# Patient Record
Sex: Male | Born: 1975 | Race: White | Hispanic: No | Marital: Married | Smoking: Never smoker
Health system: Southern US, Community
[De-identification: ages and names within clinical notes are randomized; demographics above are authoritative.]

## PROBLEM LIST (undated history)

## (undated) DIAGNOSIS — I10 Essential (primary) hypertension: Secondary | ICD-10-CM

## (undated) DIAGNOSIS — R519 Headache, unspecified: Secondary | ICD-10-CM

## (undated) DIAGNOSIS — F419 Anxiety disorder, unspecified: Secondary | ICD-10-CM

## (undated) DIAGNOSIS — M1990 Unspecified osteoarthritis, unspecified site: Secondary | ICD-10-CM

## (undated) DIAGNOSIS — F32A Depression, unspecified: Secondary | ICD-10-CM

## (undated) HISTORY — PX: BACK SURGERY: SHX140

---

## 1998-04-30 ENCOUNTER — Emergency Department (HOSPITAL_COMMUNITY): Admission: EM | Admit: 1998-04-30 | Discharge: 1998-04-30 | Payer: Self-pay | Admitting: Emergency Medicine

## 1999-10-26 ENCOUNTER — Encounter: Payer: Self-pay | Admitting: Internal Medicine

## 1999-10-26 ENCOUNTER — Encounter: Admission: RE | Admit: 1999-10-26 | Discharge: 1999-10-26 | Payer: Self-pay | Admitting: Internal Medicine

## 2001-06-13 ENCOUNTER — Emergency Department (HOSPITAL_COMMUNITY): Admission: EM | Admit: 2001-06-13 | Discharge: 2001-06-13 | Payer: Self-pay | Admitting: Emergency Medicine

## 2001-06-13 ENCOUNTER — Encounter: Payer: Self-pay | Admitting: *Deleted

## 2002-02-16 ENCOUNTER — Ambulatory Visit (HOSPITAL_COMMUNITY): Admission: RE | Admit: 2002-02-16 | Discharge: 2002-02-16 | Payer: Self-pay | Admitting: Internal Medicine

## 2002-06-11 ENCOUNTER — Encounter: Payer: Self-pay | Admitting: Chiropractor

## 2002-06-11 ENCOUNTER — Ambulatory Visit (HOSPITAL_COMMUNITY): Admission: RE | Admit: 2002-06-11 | Discharge: 2002-06-11 | Payer: Self-pay | Admitting: Chiropractor

## 2002-06-18 ENCOUNTER — Ambulatory Visit (HOSPITAL_COMMUNITY): Admission: RE | Admit: 2002-06-18 | Discharge: 2002-06-19 | Payer: Self-pay | Admitting: Orthopaedic Surgery

## 2002-06-18 ENCOUNTER — Encounter: Payer: Self-pay | Admitting: Orthopaedic Surgery

## 2003-08-18 ENCOUNTER — Encounter: Admission: RE | Admit: 2003-08-18 | Discharge: 2003-08-18 | Payer: Self-pay | Admitting: Internal Medicine

## 2003-08-18 IMAGING — CR DG CHEST 2V
2 series · 2 of 2 positions shown · non-contrast
Comparison: none

CLINICAL DATA: The patient has a cough with fever.
 CHEST - 2 VIEWS 
 PA and lateral views of the chest reveal the heart size to be normal.  The perihilar markings are accentuated without areas of infiltration.
 IMPRESSION
 No active disease.

[view not recorded (1 of 2)]
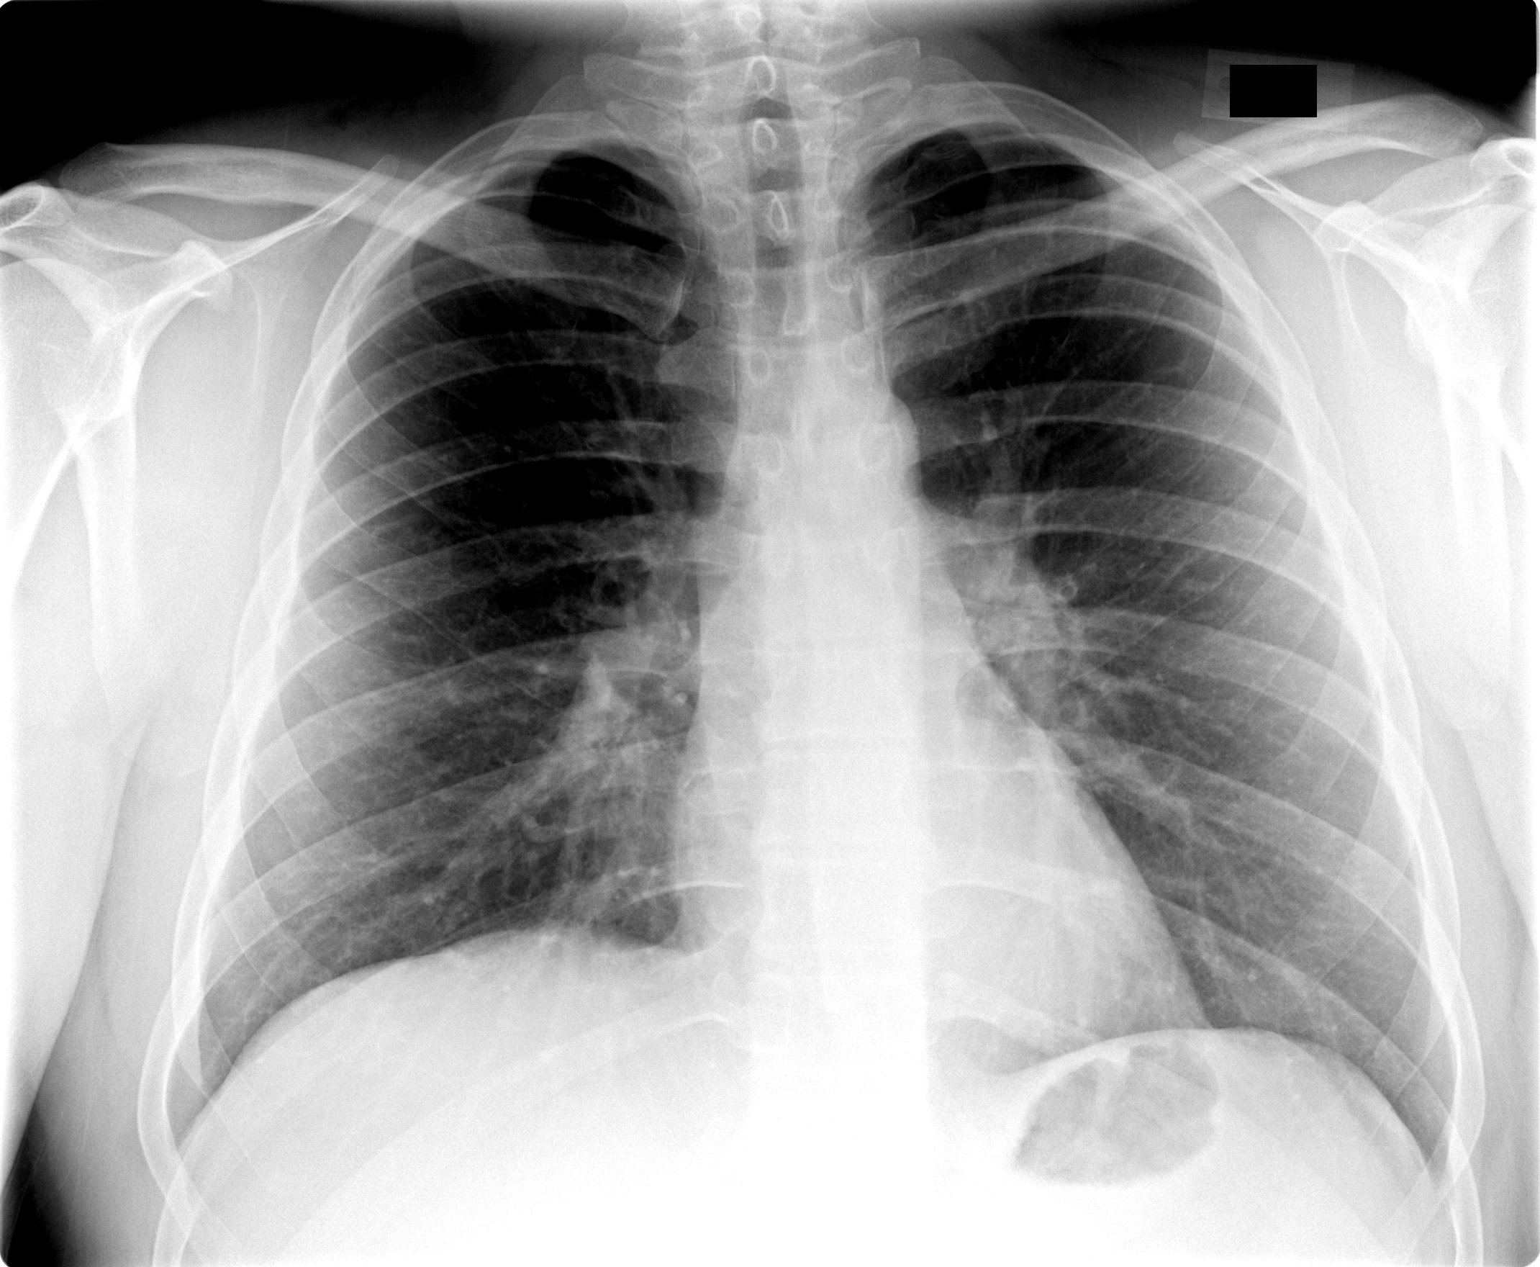

[view not recorded (2 of 2)]
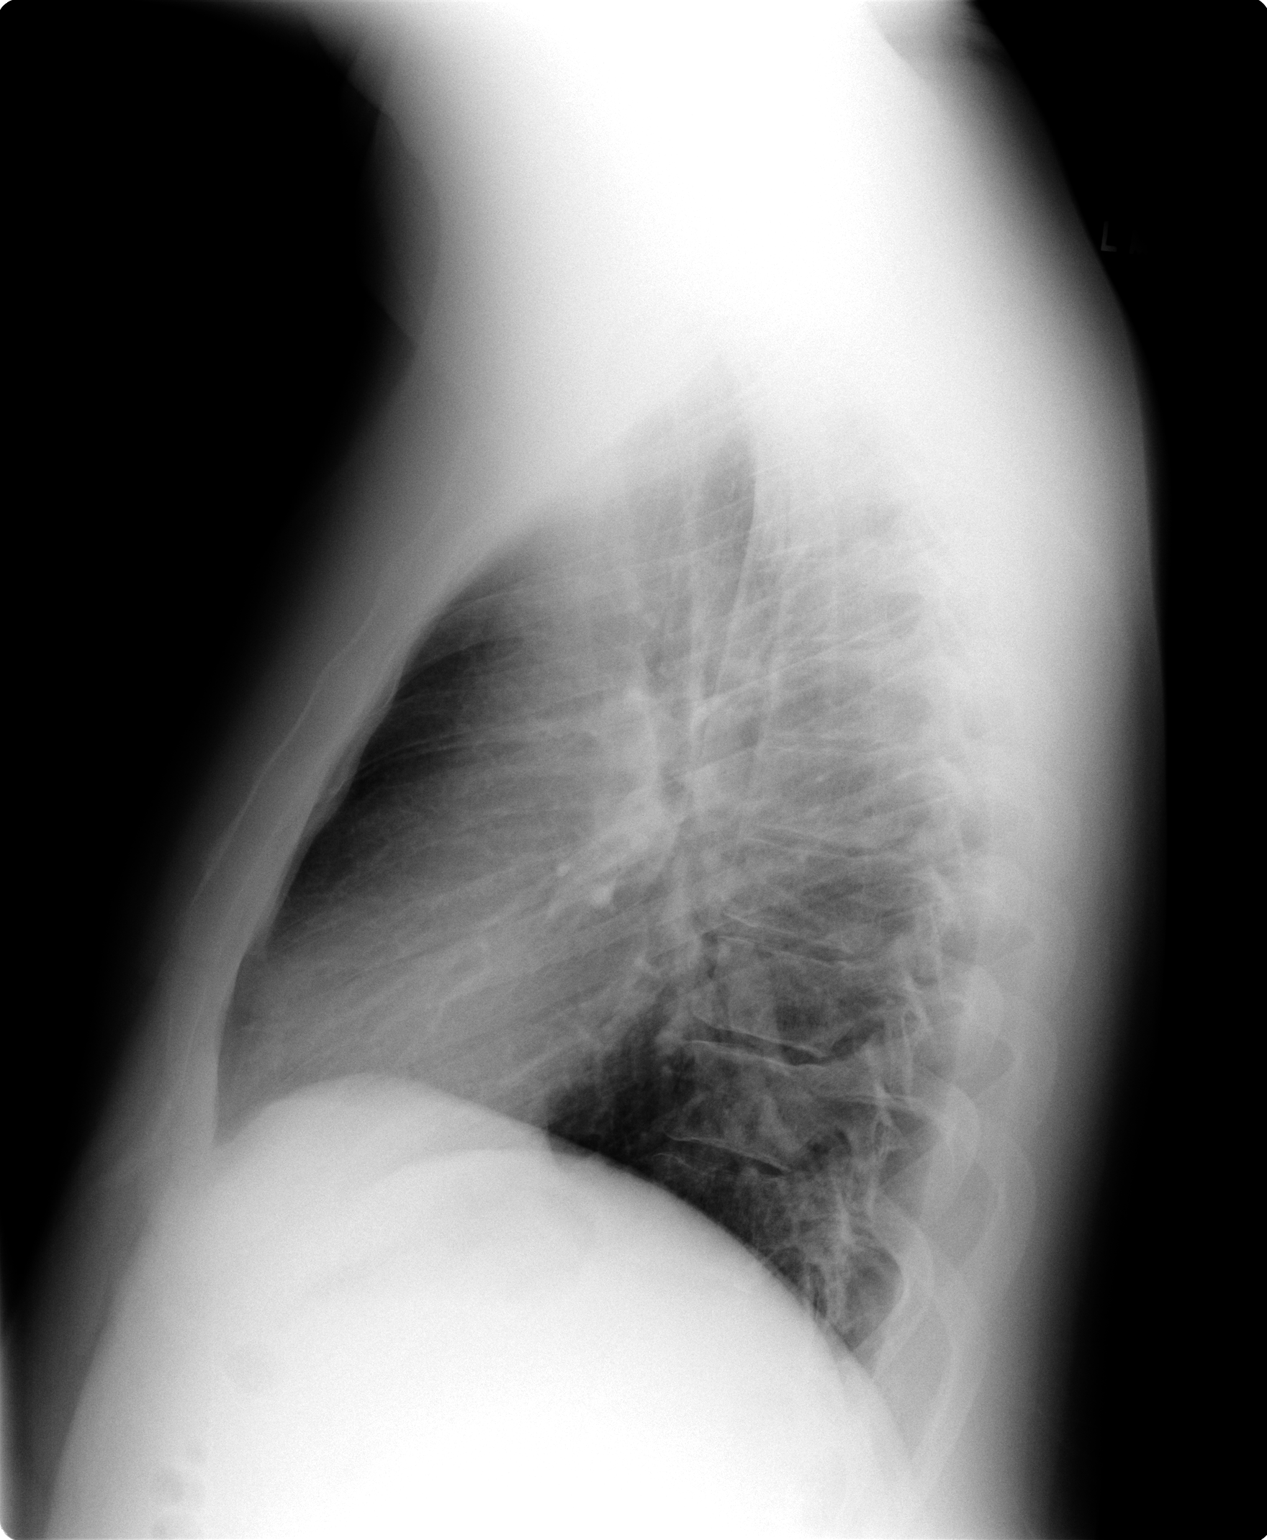

[2 of 2 positions shown; findings below may reference images not displayed]

## 2010-04-07 ENCOUNTER — Ambulatory Visit (HOSPITAL_COMMUNITY): Admission: RE | Admit: 2010-04-07 | Discharge: 2010-04-07 | Payer: Self-pay | Admitting: Internal Medicine

## 2010-10-13 NOTE — Op Note (Signed)
NAME:  Sergio Barr, Sergio Barr NO.:  [id number]   MEDICAL RECORD NO.:  [id number]                   PATIENT TYPE:  OIB   LOCATION:  2874                                 FACILITY:  [facility]   PHYSICIAN:  Sharolyn Douglas, M.D.                     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   OPERATION PERFORMED:  Microendoscopic left L4-5 diskectomy with removal of  extruded disk fragment.   SURGEON:  Sharolyn Douglas, M.D.   ASSISTANT:  Real Cons, P.A.   ANESTHESIA:  General endotracheal.   COMPLICATIONS:  None.   INDICATIONS FOR PROCEDURE:  The patient is a 35 year old male with a large  extruded disk herniation on the left at L4-5.  He has had a persistent left  lower extremity radiculopathy unresponsive to conservative treatment.  The  risks, benefits, and alternatives to microendoscopic diskectomy were  extensively reviewed with the patient and he elected to proceed in hopes of  improving his symptomatology.   DESCRIPTION OF PROCEDURE:  The patient was properly identified in the  holding area and taken to the operating room.  He underwent general  endotracheal anesthesia without difficulty.  He was given prophylactic IV  antibiotics.  He was carefully turned prone to the Acromed four post  positioning frame.  All bony prominences were padded.  Face and eyes  protected at all times.  Back was prepped and draped in the usual sterile  fashion.  Fluoroscopy was brought into the field and the L4-5 level was  localized.  A 1 cm incision was made 2 cm off the midline to the left over  the L4-5 disk space.  Successively larger dilators were then placed onto the  lamina facet junction of L4-5.  This was confirmed  under fluoroscopy.  We  then placed a Maxcess retractor over the final dilator and expanded the  muscle.  The L4 lamina and 4-5 facet joint as well as the interspace were  identified and cleaned of all soft tissue.  The surgical microscope was  prepped, draped and brought into the surgical field.  The remainder of the  operation was under direct microscopic illumination and magnification.  A  high speed bur was used to remove the inferior one third of the L4 lamina as  well as the medial one third of the 4-5 facet joint complex.  The ligamentum  flavum was elevated.  The common dural sac and L5 nerve root was gently  retracted medially.  Immediately, we identified a large disk fragment  cephalad to the L4 lamina and compressing the dural sac.  This was teased  out of the  spinal canal with a nerve hook and pituitary.  The fragment  measured almost 2 x 3 cm.  Immediately, the dural sac became more mobile.  We were then able to retract the L5 nerve root, identify the disk space  where there was a bulge and several more small fragments that were removed.  The disk space was entered and all loose disk material was removed using  pituitary rongeurs.  The disk space was copiously irrigated with Angiocath  irrigation floating out several small disk fragments that then were removed  with a pituitary.  We explored the spinal canal with a blunt probe and did  not identify any additional disk material.  The L5 nerve root was free from  its take off out its respective foramen.  The wound was copiously irrigated  with irrigation.  100 mcg of fentanyl was left in the epidural space.  The  deep fascia was closed with 0 Vicryl.  The subcutaneous layer closed with 2-  0 Vicryl followed by a running 4-0 Vicryl subcuticular suture.  Benzoin and  Steri-Strips placed.  Sterile dressing applied.  Patient turned supine,  extubated without difficulty, transferred to recovery room in stable  condition.                                                  Sharolyn Douglas, M.D.    MC/MEDQ  D:  06/18/2002  T:  06/18/2002  Job:  [id number]

## 2015-11-14 ENCOUNTER — Encounter (HOSPITAL_COMMUNITY): Payer: Self-pay

## 2015-11-14 ENCOUNTER — Emergency Department (HOSPITAL_COMMUNITY)
Admission: EM | Admit: 2015-11-14 | Discharge: 2015-11-14 | Disposition: A | Discharge status: Home / Self Care | Payer: 59 | Attending: Emergency Medicine | Admitting: Emergency Medicine

## 2015-11-14 ENCOUNTER — Emergency Department (HOSPITAL_COMMUNITY): Payer: 59

## 2015-11-14 ENCOUNTER — Other Ambulatory Visit: Payer: Self-pay

## 2015-11-14 DIAGNOSIS — R5383 Other fatigue: Secondary | ICD-10-CM | POA: Diagnosis not present

## 2015-11-14 DIAGNOSIS — I10 Essential (primary) hypertension: Secondary | ICD-10-CM | POA: Insufficient documentation

## 2015-11-14 DIAGNOSIS — R079 Chest pain, unspecified: Secondary | ICD-10-CM | POA: Diagnosis present

## 2015-11-14 DIAGNOSIS — R0789 Other chest pain: Secondary | ICD-10-CM | POA: Insufficient documentation

## 2015-11-14 HISTORY — DX: Essential (primary) hypertension: I10

## 2015-11-14 LAB — CBC
HEMATOCRIT: 46.6 % (ref 39.0–52.0)
Hemoglobin: 15.7 g/dL (ref 13.0–17.0)
MCH: 30.7 pg (ref 26.0–34.0)
MCHC: 33.7 g/dL (ref 30.0–36.0)
MCV: 91.2 fL (ref 78.0–100.0)
PLATELETS: 152 10*3/uL (ref 150–400)
RBC: 5.11 MIL/uL (ref 4.22–5.81)
RDW: 13 % (ref 11.5–15.5)
WBC: 8.3 10*3/uL (ref 4.0–10.5)

## 2015-11-14 LAB — I-STAT TROPONIN, ED
TROPONIN I, POC: 0 ng/mL (ref 0.00–0.08)
TROPONIN I, POC: 0.01 ng/mL (ref 0.00–0.08)

## 2015-11-14 LAB — BASIC METABOLIC PANEL
Anion gap: 10 (ref 5–15)
BUN: 13 mg/dL (ref 6–20)
CALCIUM: 10 mg/dL (ref 8.9–10.3)
CO2: 26 mmol/L (ref 22–32)
CREATININE: 1.09 mg/dL (ref 0.61–1.24)
Chloride: 103 mmol/L (ref 101–111)
GFR calc Af Amer: >60 mL/min (ref >60)
Glucose, Bld: 106 mg/dL — ABNORMAL HIGH (ref 65–99)
POTASSIUM: 4.7 mmol/L (ref 3.5–5.1)
SODIUM: 139 mmol/L (ref 135–145)

## 2015-11-14 IMAGING — DX DG CHEST 2V
2 series · 3 of 3 positions shown · non-contrast
Comparison: [DATE]

CLINICAL DATA: Chest pain, body aches, weakness, nausea

EXAM:
CHEST  2 VIEW

[Series 1: chest pa · 0.14mm/px · 2 of 2 slices shown]
[im 1/2]
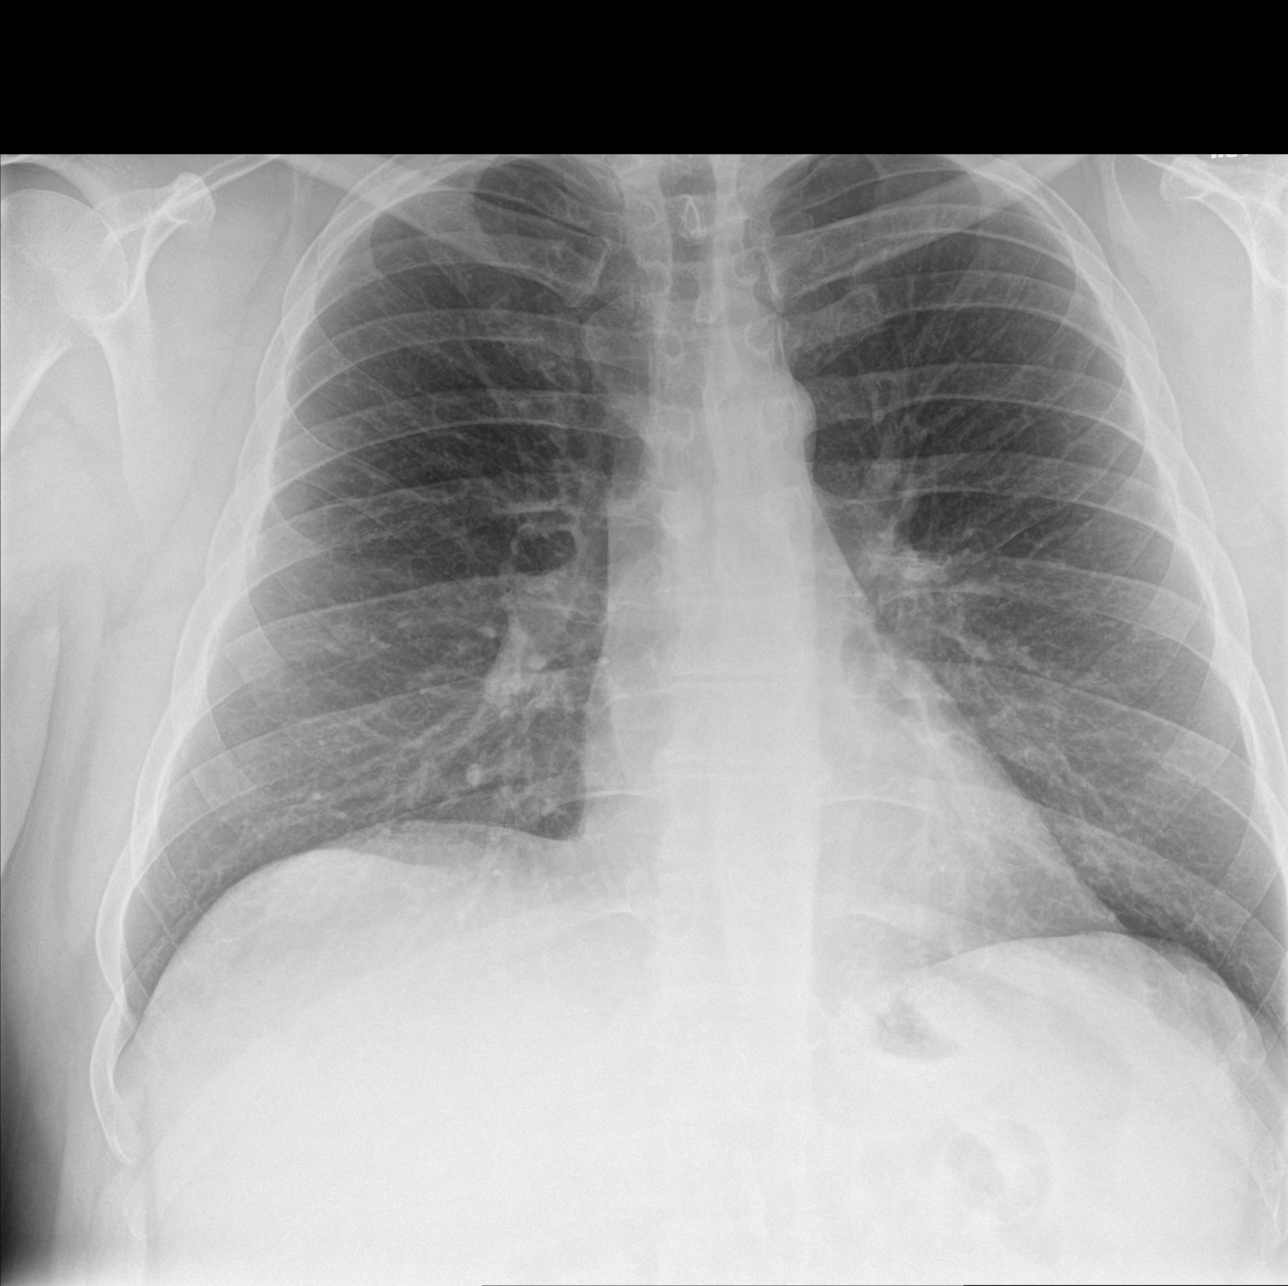
[im 2/2]
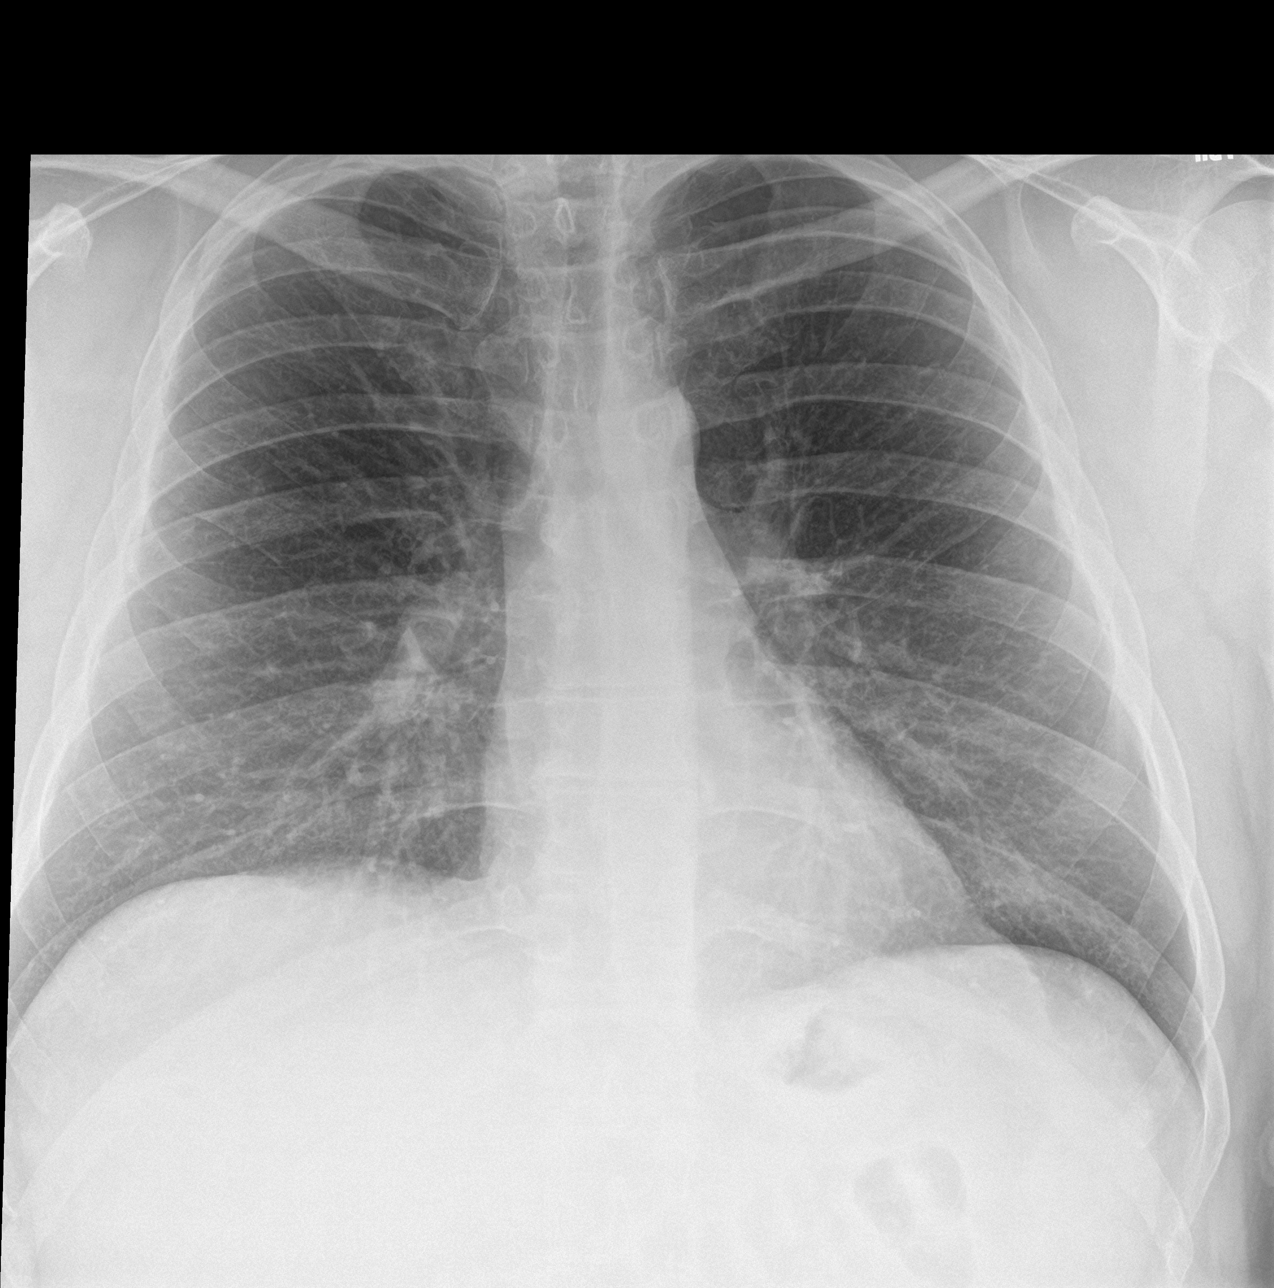

[chest lat]
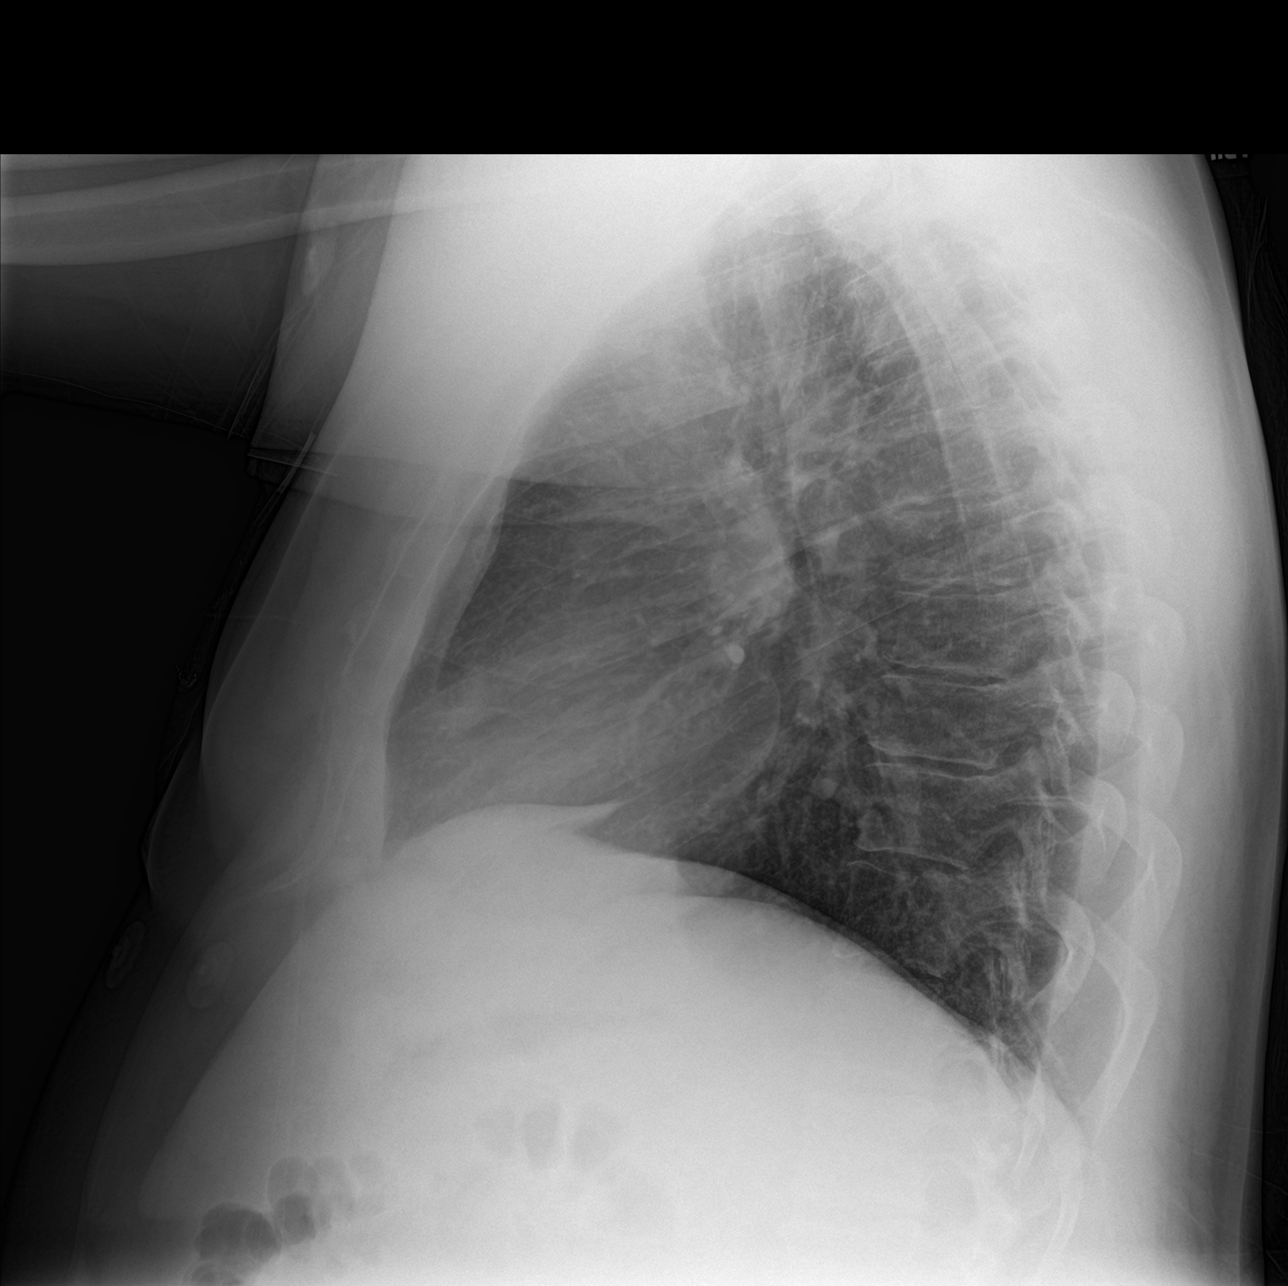

[3 of 3 positions shown; findings below may reference images not displayed]

FINDINGS: Cardiomediastinal silhouette is unremarkable. No acute infiltrate or
pleural effusion. No pulmonary edema. Bony thorax is unremarkable.
IMPRESSION: No active cardiopulmonary disease.

## 2015-11-14 MED ORDER — ALBUTEROL SULFATE (2.5 MG/3ML) 0.083% IN NEBU
5.0000 mg | INHALATION_SOLUTION | Freq: Once | RESPIRATORY_TRACT | Status: AC
  Administered 2015-11-14: 5 mg via RESPIRATORY_TRACT
  Filled 2015-11-14: qty 6

## 2015-11-14 MED ORDER — ASPIRIN 81 MG PO CHEW
324.0000 mg | CHEWABLE_TABLET | Freq: Once | ORAL | Status: AC
  Administered 2015-11-14: 324 mg via ORAL
  Filled 2015-11-14: qty 4

## 2015-11-14 MED ORDER — ONDANSETRON HCL 4 MG/2ML IJ SOLN
4.0000 mg | Freq: Once | INTRAMUSCULAR | Status: AC
  Administered 2015-11-14: 4 mg via INTRAVENOUS
  Filled 2015-11-14: qty 2

## 2015-11-14 NOTE — Discharge Instructions (Signed)
Please follow with your primary care doctor in the next 2 days for a check-up. They must obtain records for further management.  ° °Do not hesitate to return to the Emergency Department for any new, worsening or concerning symptoms.  ° ° °Nonspecific Chest Pain  °Chest pain can be caused by many different conditions. There is always a chance that your pain could be related to something serious, such as a heart attack or a blood clot in your lungs. Chest pain can also be caused by conditions that are not life-threatening. If you have chest pain, it is very important to follow up with your health care provider. °CAUSES  °Chest pain can be caused by: °· Heartburn. °· Pneumonia or bronchitis. °· Anxiety or stress. °· Inflammation around your heart (pericarditis) or lung (pleuritis or pleurisy). °· A blood clot in your lung. °· A collapsed lung (pneumothorax). It can develop suddenly on its own (spontaneous pneumothorax) or from trauma to the chest. °· Shingles infection (varicella-zoster virus). °· Heart attack. °· Damage to the bones, muscles, and cartilage that make up your chest wall. This can include: °¨ Bruised bones due to injury. °¨ Strained muscles or cartilage due to frequent or repeated coughing or overwork. °¨ Fracture to one or more ribs. °¨ Sore cartilage due to inflammation (costochondritis). °RISK FACTORS  °Risk factors for chest pain may include: °· Activities that increase your risk for trauma or injury to your chest. °· Respiratory infections or conditions that cause frequent coughing. °· Medical conditions or overeating that can cause heartburn. °· Heart disease or family history of heart disease. °· Conditions or health behaviors that increase your risk of developing a blood clot. °· Having had chicken pox (varicella zoster). °SIGNS AND SYMPTOMS °Chest pain can feel like: °· Burning or tingling on the surface of your chest or deep in your chest. °· Crushing, pressure, aching, or squeezing  pain. °· Dull or sharp pain that is worse when you move, cough, or take a deep breath. °· Pain that is also felt in your back, neck, shoulder, or arm, or pain that spreads to any of these areas. °Your chest pain may come and go, or it may stay constant. °DIAGNOSIS °Lab tests or other studies may be needed to find the cause of your pain. Your health care provider may have you take a test called an ambulatory ECG (electrocardiogram). An ECG records your heartbeat patterns at the time the test is performed. You may also have other tests, such as: °· Transthoracic echocardiogram (TTE). During echocardiography, sound waves are used to create a picture of all of the heart structures and to look at how blood flows through your heart. °· Transesophageal echocardiogram (TEE). This is a more advanced imaging test that obtains images from inside your body. It allows your health care provider to see your heart in finer detail. °· Cardiac monitoring. This allows your health care provider to monitor your heart rate and rhythm in real time. °· Holter monitor. This is a portable device that records your heartbeat and can help to diagnose abnormal heartbeats. It allows your health care provider to track your heart activity for several days, if needed. °· Stress tests. These can be done through exercise or by taking medicine that makes your heart beat more quickly. °· Blood tests. °· Imaging tests. °TREATMENT  °Your treatment depends on what is causing your chest pain. Treatment may include: °· Medicines. These may include: °¨ Acid blockers for heartburn. °¨ Anti-inflammatory medicine. °¨ Pain   medicine for inflammatory conditions. °¨ Antibiotic medicine, if an infection is present. °¨ Medicines to dissolve blood clots. °¨ Medicines to treat coronary artery disease. °· Supportive care for conditions that do not require medicines. This may include: °¨ Resting. °¨ Applying heat or cold packs to injured areas. °¨ Limiting activities  until pain decreases. °HOME CARE INSTRUCTIONS °· If you were prescribed an antibiotic medicine, finish it all even if you start to feel better. °· Avoid any activities that bring on chest pain. °· Do not use any tobacco products, including cigarettes, chewing tobacco, or electronic cigarettes. If you need help quitting, ask your health care provider. °· Do not drink alcohol. °· Take medicines only as directed by your health care provider. °· Keep all follow-up visits as directed by your health care provider. This is important. This includes any further testing if your chest pain does not go away. °· If heartburn is the cause for your chest pain, you may be told to keep your head raised (elevated) while sleeping. This reduces the chance that acid will go from your stomach into your esophagus. °· Make lifestyle changes as directed by your health care provider. These may include: °¨ Getting regular exercise. Ask your health care provider to suggest some activities that are safe for you. °¨ Eating a heart-healthy diet. A registered dietitian can help you to learn healthy eating options. °¨ Maintaining a healthy weight. °¨ Managing diabetes, if necessary. °¨ Reducing stress. °SEEK MEDICAL CARE IF: °· Your chest pain does not go away after treatment. °· You have a rash with blisters on your chest. °· You have a fever. °SEEK IMMEDIATE MEDICAL CARE IF:  °· Your chest pain is worse. °· You have an increasing cough, or you cough up blood. °· You have severe abdominal pain. °· You have severe weakness. °· You faint. °· You have chills. °· You have sudden, unexplained chest discomfort. °· You have sudden, unexplained discomfort in your arms, back, neck, or jaw. °· You have shortness of breath at any time. °· You suddenly start to sweat, or your skin gets clammy. °· You feel nauseous or you vomit. °· You suddenly feel light-headed or dizzy. °· Your heart begins to beat quickly, or it feels like it is skipping beats. °These  symptoms may represent a serious problem that is an emergency. Do not wait to see if the symptoms will go away. Get medical help right away. Call your local emergency services (911 in the [place] Do not drive yourself to the hospital. °  °This information is not intended to replace advice given to you by your health care provider. Make sure you discuss any questions you have with your health care provider. °  °Document Released: 02/21/2005 Document Revised: 06/04/2014 Document Reviewed: 12/18/2013 °Elsevier Interactive Patient Education ©2016 [employer] ° °

## 2015-11-14 NOTE — ED Notes (Signed)
Pt complaining of chest tightness. Pt also states faitgue. Pt states sharp left rib pain, radiating to L arm and leg.

## 2015-11-14 NOTE — ED Provider Notes (Signed)
CSN: [id number]     Arrival date & time 11/14/15  1557 History   First MD Initiated Contact with Patient 11/14/15 1759     Chief Complaint  Patient presents with  . Chest Pain     (Consider location/radiation/quality/duration/timing/severity/associated sxs/prior Treatment) HPI  Blood pressure 127/78, pulse 87, temperature 98.9 F (37.2 C), temperature source Oral, resp. rate 13, height 5\' 11"  (1.803 m), weight 122.471 kg, SpO2 98 %.  Montez MoritaWilliam F Zollars is a 40 y.o. male complaining of left sided chest pain onset upon waking this morning, states that sometimes it radiates to the left arm and left leg, he had a prodrome of fatigue over the last 3 weeks with no cough, fever, chills. Patient states that the pain is 8 out of 10 at worst, 4 out of 10 right now, states that the pain was worse while he was at work, he is a Psychologist, occupationalwelder, he doesn't think that it's exertional states that sometimes is exacerbated by deep breathing. He's had no appetite today and also soft stool with associated nausea. Patient smokes marijuana occasionally, he is not a tobacco smoker areas history of hypertension and he is compliant with his medications no hyperlipidemia, states father had heart attack at age 240 last stress test was 5 years ago without abnormality. Pt denies fever, cough, h/o DVT/PE, calf pain or leg swelling, hemoptysis, recent immobilization, cancer/chemotherapy in the last 6 months.    Past Medical History  Diagnosis Date  . Hypertension    History reviewed. No pertinent past surgical history. History reviewed. No pertinent family history. Social History  Substance Use Topics  . Smoking status: Never Smoker   . Smokeless tobacco: None  . Alcohol Use: Yes    Review of Systems  10 systems reviewed and found to be negative, except as noted in the HPI.   Allergies  Review of patient's allergies indicates not on file.  Home Medications   Prior to Admission medications   Not on File   BP 121/72  mmHg  Pulse 100  Temp(Src) 98.9 F (37.2 C) (Oral)  Resp 18  Ht 5\' 11"  (1.803 m)  Wt 122.471 kg  BMI 37.67 kg/m2  SpO2 97% Physical Exam  Constitutional: He is oriented to person, place, and time. He appears well-developed and well-nourished. No distress.  HENT:  Head: Normocephalic and atraumatic.  Mouth/Throat: Oropharynx is clear and moist.  Eyes: Conjunctivae and EOM are normal. Pupils are equal, round, and reactive to light.  Neck: Normal range of motion. No JVD present. No tracheal deviation present.  Cardiovascular: Normal rate, regular rhythm and intact distal pulses.   Radial pulse equal bilaterally  Pulmonary/Chest: Effort normal and breath sounds normal. No stridor. No respiratory distress. He has no wheezes. He has no rales. He exhibits no tenderness.  Abdominal: Soft. Bowel sounds are normal. He exhibits no distension and no mass. There is no tenderness. There is no rebound and no guarding.  Musculoskeletal: Normal range of motion. He exhibits no edema or tenderness.  No calf asymmetry, superficial collaterals, palpable cords, edema, Homans sign negative bilaterally.    Neurological: He is alert and oriented to person, place, and time.  Skin: Skin is warm. He is not diaphoretic.  Psychiatric: He has a normal mood and affect.  Nursing note and vitals reviewed.   ED Course  Procedures (including critical care time) Labs Review Labs Reviewed  BASIC METABOLIC PANEL - Abnormal; Notable for the following:    Glucose, Bld 106 (*)  All other components within normal limits  CBC  I-STAT TROPOININ, ED  Rosezena Sensor, ED    Imaging Review Dg Chest 2 View  11/14/2015  CLINICAL DATA:  Chest pain, body aches, weakness, nausea EXAM: CHEST  2 VIEW COMPARISON:  08/18/2003 FINDINGS: Cardiomediastinal silhouette is unremarkable. No acute infiltrate or pleural effusion. No pulmonary edema. Bony thorax is unremarkable. IMPRESSION: No active cardiopulmonary disease.  Electronically Signed   By: Natasha Mead M.D.   On: 11/14/2015 17:06   I have personally reviewed and evaluated these images and lab results as part of my medical decision-making.   EKG Interpretation   Date/Time:  Monday November 14 2015 16:05:09 EDT Ventricular Rate:  90 PR Interval:  128 QRS Duration: 82 QT Interval:  350 QTC Calculation: 428 R Axis:   68 Text Interpretation:  Normal sinus rhythm Normal ECG No old tracing to  compare Confirmed by [facility]  MD, ELLIOTT [phone]) on 11/14/2015 7:30:48 PM      MDM   Final diagnoses:  Atypical chest pain    Filed Vitals:   11/14/15 1900 11/14/15 1930 11/14/15 1945 11/14/15 2000  BP: 136/81 135/87 129/76 121/72  Pulse: 87 103 100 100  Temp:      TempSrc:      Resp: Height:      Weight:      SpO2: 100% 94% 95% 97%    Medications  ondansetron (ZOFRAN) injection 4 mg (4 mg Intravenous Given 11/14/15 1857)  aspirin chewable tablet 324 mg (324 mg Oral Given 11/14/15 1857)  albuterol (PROVENTIL) (2.5 MG/3ML) 0.083% nebulizer solution 5 mg (5 mg Nebulization Given 11/14/15 1856)    SALAH NAKAMURA is 40 y.o. male presenting with Chest tightness, somewhat pleuritic in nature onset this a.m. radiates to left arm and left leg, has prodrome of generalized fatigue for several months. Was sent by primary care for further evaluation. Patient has family history of CAD with father having heart attack in his 29s, patient smokes marijuana occasionally and also has high blood pressure. EKG with no arrhythmia or ischemic changes. Chest x-ray negative, initial troponin negative. Patient will be given full dose aspirin and will obtain delta troponin, patient is low risk by heart score and PERC negative.  Repeat troponin is negative.  Evaluation does not show pathology that would require ongoing emergent intervention or inpatient treatment. Pt is hemodynamically stable and mentating appropriately. Discussed findings and plan with patient/guardian,  who agrees with care plan. All questions answered. Return precautions discussed and outpatient follow up given.      Wynetta Emery, PA-C 11/14/15 2020  Mancel Bale, MD 11/15/15 [phone]

## 2017-03-20 ENCOUNTER — Ambulatory Visit: Payer: Self-pay

## 2017-03-20 ENCOUNTER — Ambulatory Visit: Payer: Self-pay | Admitting: Physician Assistant

## 2017-03-20 ENCOUNTER — Other Ambulatory Visit: Payer: Self-pay | Admitting: Occupational Medicine

## 2017-03-20 DIAGNOSIS — M79644 Pain in right finger(s): Secondary | ICD-10-CM

## 2017-03-20 IMAGING — DX DG FINGER THUMB 2+V*R*
3 series · 3 of 3 positions shown · non-contrast
Comparison: None

CLINICAL DATA: Blunt trauma the RIGHT fell on [DATE] [DATE], pain at
proximal phalanx

EXAM:
RIGHT THUMB 2+V

[finger ap]
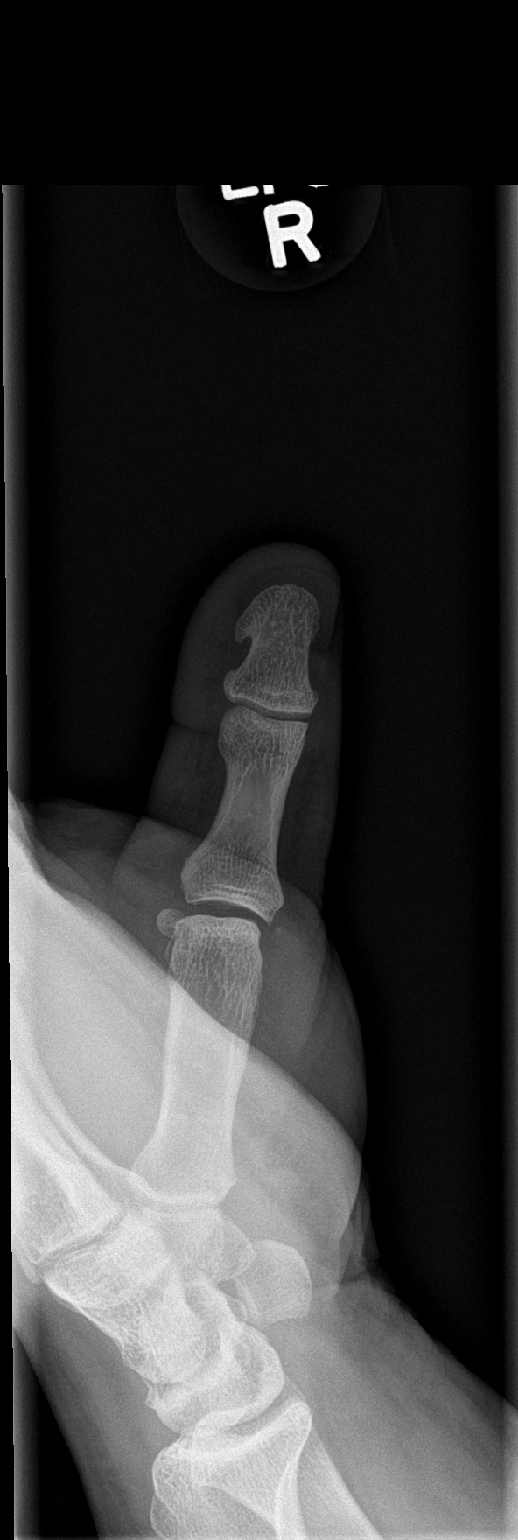

[finger obl]
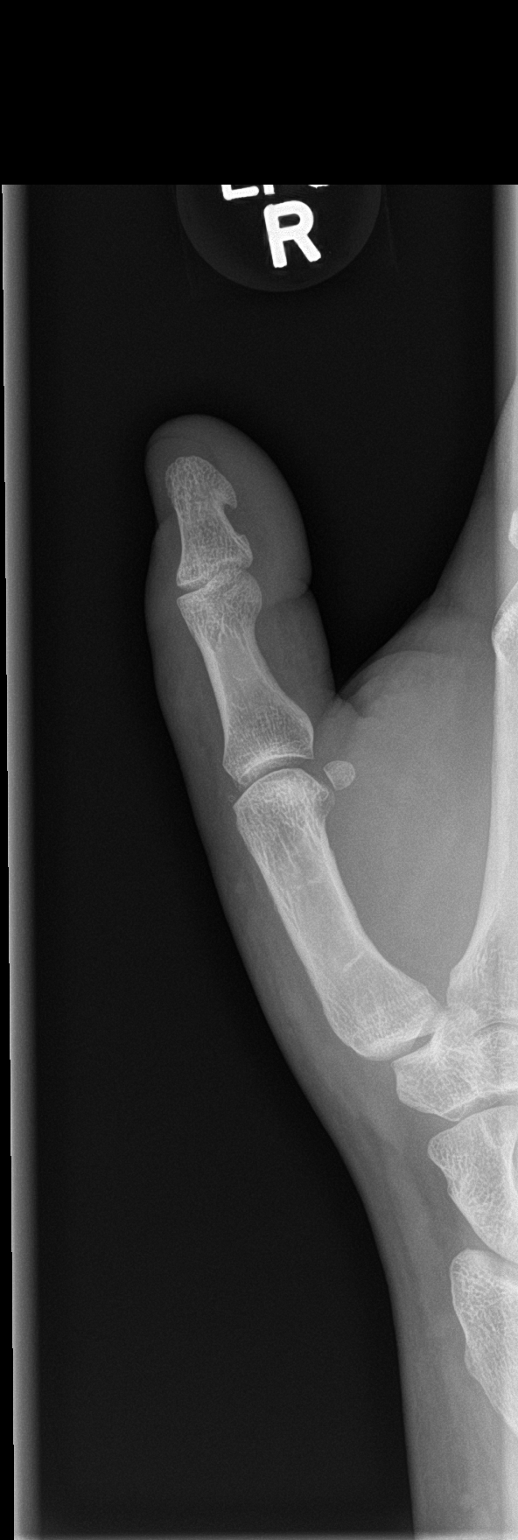

[finger lat]
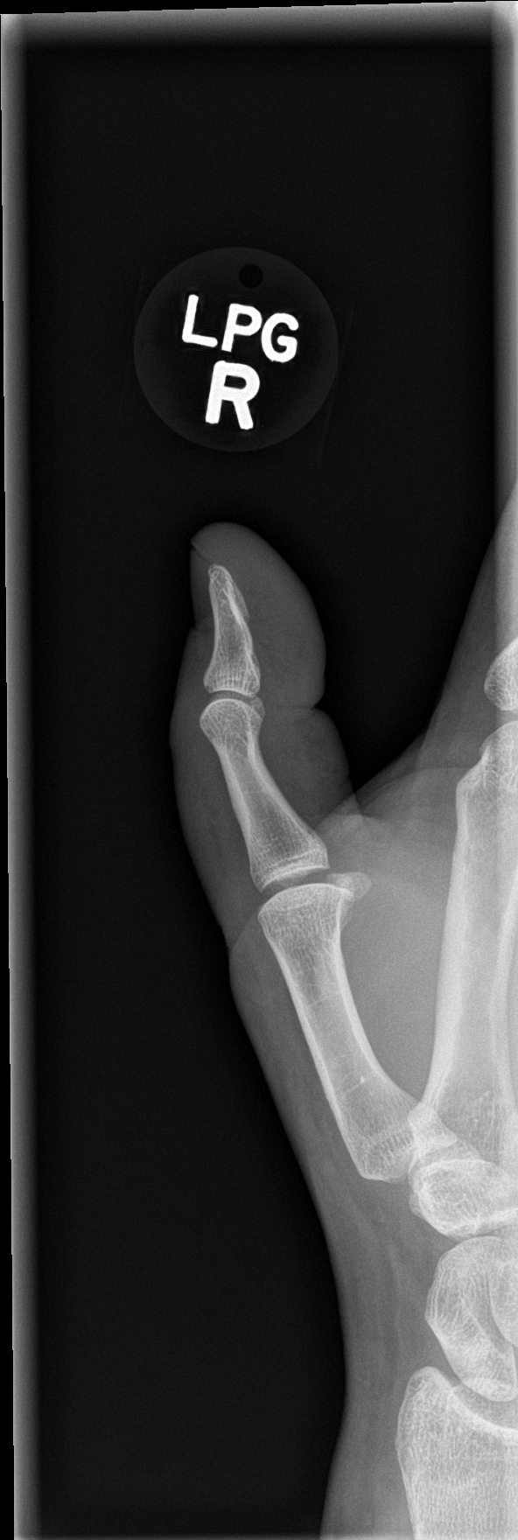

[3 of 3 positions shown; findings below may reference images not displayed]

FINDINGS: Osseous mineralization normal.

Joint spaces preserved.

No fracture, dislocation, or bone destruction.
IMPRESSION: Normal exam.

## 2019-08-04 ENCOUNTER — Other Ambulatory Visit: Payer: Self-pay | Admitting: Otolaryngology

## 2019-08-20 ENCOUNTER — Ambulatory Visit: Payer: 59 | Attending: Internal Medicine

## 2019-08-20 DIAGNOSIS — Z23 Encounter for immunization: Secondary | ICD-10-CM

## 2019-08-20 NOTE — Progress Notes (Signed)
   Covid-19 Vaccination Clinic  Name:  Sergio Barr    MRN: [id number] DOB: 1976/01/27  08/20/2019  [name] was observed post Covid-19 immunization for 15 minutes without incident. He was provided with Vaccine Information Sheet and instruction to access the V-Safe system.   [name] was instructed to call 911 with any severe reactions post vaccine: Marland Kitchen Difficulty breathing  . Swelling of face and throat  . A fast heartbeat  . A bad rash all over body  . Dizziness and weakness   Immunizations Administered    Name Date Dose VIS Date Route   Pfizer COVID-19 Vaccine 08/20/2019  3:42 PM 0.3 mL 05/08/2019 Intramuscular   Manufacturer: [employer], [employer]   Lot: [id number]   NDC: [address]-7

## 2019-08-21 ENCOUNTER — Ambulatory Visit: Admit: 2019-08-21 | Payer: 59 | Admitting: Otolaryngology

## 2019-08-21 SURGERY — TONSILLECTOMY
Anesthesia: General | Laterality: Bilateral

## 2019-09-14 ENCOUNTER — Ambulatory Visit: Payer: 59 | Attending: Internal Medicine

## 2019-09-14 DIAGNOSIS — Z23 Encounter for immunization: Secondary | ICD-10-CM

## 2019-09-14 NOTE — Progress Notes (Signed)
   Covid-19 Vaccination Clinic  Name:  Sergio Barr    MRN: [id number] DOB: 12/07/1975  09/14/2019  Mr. [name] was observed post Covid-19 immunization for 15 minutes without incident. He was provided with Vaccine Information Sheet and instruction to access the V-Safe system.   [name] was instructed to call 911 with any severe reactions post vaccine: Marland Kitchen Difficulty breathing  . Swelling of face and throat  . A fast heartbeat  . A bad rash all over body  . Dizziness and weakness   Immunizations Administered    Name Date Dose VIS Date Route   Pfizer COVID-19 Vaccine 09/14/2019  1:46 PM 0.3 mL 07/22/2018 Intramuscular   Manufacturer: [employer], [employer]   Lot: [id number]   NDC: [address]-1

## 2021-03-22 DIAGNOSIS — Z1331 Encounter for screening for depression: Secondary | ICD-10-CM | POA: Diagnosis not present

## 2021-03-22 DIAGNOSIS — Z1339 Encounter for screening examination for other mental health and behavioral disorders: Secondary | ICD-10-CM | POA: Diagnosis not present

## 2021-03-22 DIAGNOSIS — I10 Essential (primary) hypertension: Secondary | ICD-10-CM | POA: Diagnosis not present

## 2021-04-05 DIAGNOSIS — I10 Essential (primary) hypertension: Secondary | ICD-10-CM | POA: Diagnosis not present

## 2021-04-05 DIAGNOSIS — M5386 Other specified dorsopathies, lumbar region: Secondary | ICD-10-CM | POA: Diagnosis not present

## 2021-04-05 DIAGNOSIS — M9902 Segmental and somatic dysfunction of thoracic region: Secondary | ICD-10-CM | POA: Diagnosis not present

## 2021-04-05 DIAGNOSIS — M9903 Segmental and somatic dysfunction of lumbar region: Secondary | ICD-10-CM | POA: Diagnosis not present

## 2021-04-05 DIAGNOSIS — Z125 Encounter for screening for malignant neoplasm of prostate: Secondary | ICD-10-CM | POA: Diagnosis not present

## 2021-04-05 DIAGNOSIS — M9901 Segmental and somatic dysfunction of cervical region: Secondary | ICD-10-CM | POA: Diagnosis not present

## 2021-04-07 DIAGNOSIS — G4733 Obstructive sleep apnea (adult) (pediatric): Secondary | ICD-10-CM | POA: Diagnosis not present

## 2021-04-10 DIAGNOSIS — M9902 Segmental and somatic dysfunction of thoracic region: Secondary | ICD-10-CM | POA: Diagnosis not present

## 2021-04-10 DIAGNOSIS — M9903 Segmental and somatic dysfunction of lumbar region: Secondary | ICD-10-CM | POA: Diagnosis not present

## 2021-04-10 DIAGNOSIS — M9901 Segmental and somatic dysfunction of cervical region: Secondary | ICD-10-CM | POA: Diagnosis not present

## 2021-04-10 DIAGNOSIS — M5386 Other specified dorsopathies, lumbar region: Secondary | ICD-10-CM | POA: Diagnosis not present

## 2021-04-12 DIAGNOSIS — F418 Other specified anxiety disorders: Secondary | ICD-10-CM | POA: Diagnosis not present

## 2021-04-12 DIAGNOSIS — Z0000 Encounter for general adult medical examination without abnormal findings: Secondary | ICD-10-CM | POA: Diagnosis not present

## 2021-04-12 DIAGNOSIS — F1290 Cannabis use, unspecified, uncomplicated: Secondary | ICD-10-CM | POA: Diagnosis not present

## 2021-04-12 DIAGNOSIS — I10 Essential (primary) hypertension: Secondary | ICD-10-CM | POA: Diagnosis not present

## 2021-04-12 DIAGNOSIS — Z23 Encounter for immunization: Secondary | ICD-10-CM | POA: Diagnosis not present

## 2021-05-07 DIAGNOSIS — G4733 Obstructive sleep apnea (adult) (pediatric): Secondary | ICD-10-CM | POA: Diagnosis not present

## 2021-06-07 DIAGNOSIS — G4733 Obstructive sleep apnea (adult) (pediatric): Secondary | ICD-10-CM | POA: Diagnosis not present

## 2021-07-09 ENCOUNTER — Encounter: Payer: Self-pay | Admitting: Internal Medicine

## 2021-07-10 DIAGNOSIS — M9902 Segmental and somatic dysfunction of thoracic region: Secondary | ICD-10-CM | POA: Diagnosis not present

## 2021-07-10 DIAGNOSIS — M9901 Segmental and somatic dysfunction of cervical region: Secondary | ICD-10-CM | POA: Diagnosis not present

## 2021-07-10 DIAGNOSIS — M5386 Other specified dorsopathies, lumbar region: Secondary | ICD-10-CM | POA: Diagnosis not present

## 2021-07-10 DIAGNOSIS — M9903 Segmental and somatic dysfunction of lumbar region: Secondary | ICD-10-CM | POA: Diagnosis not present

## 2021-07-11 DIAGNOSIS — M9901 Segmental and somatic dysfunction of cervical region: Secondary | ICD-10-CM | POA: Diagnosis not present

## 2021-07-11 DIAGNOSIS — M9903 Segmental and somatic dysfunction of lumbar region: Secondary | ICD-10-CM | POA: Diagnosis not present

## 2021-07-11 DIAGNOSIS — M9902 Segmental and somatic dysfunction of thoracic region: Secondary | ICD-10-CM | POA: Diagnosis not present

## 2021-07-11 DIAGNOSIS — M5386 Other specified dorsopathies, lumbar region: Secondary | ICD-10-CM | POA: Diagnosis not present

## 2021-07-18 DIAGNOSIS — M9902 Segmental and somatic dysfunction of thoracic region: Secondary | ICD-10-CM | POA: Diagnosis not present

## 2021-07-18 DIAGNOSIS — M5386 Other specified dorsopathies, lumbar region: Secondary | ICD-10-CM | POA: Diagnosis not present

## 2021-07-18 DIAGNOSIS — M9903 Segmental and somatic dysfunction of lumbar region: Secondary | ICD-10-CM | POA: Diagnosis not present

## 2021-07-18 DIAGNOSIS — M9901 Segmental and somatic dysfunction of cervical region: Secondary | ICD-10-CM | POA: Diagnosis not present

## 2021-07-25 DIAGNOSIS — M5386 Other specified dorsopathies, lumbar region: Secondary | ICD-10-CM | POA: Diagnosis not present

## 2021-07-25 DIAGNOSIS — M9902 Segmental and somatic dysfunction of thoracic region: Secondary | ICD-10-CM | POA: Diagnosis not present

## 2021-07-25 DIAGNOSIS — M9903 Segmental and somatic dysfunction of lumbar region: Secondary | ICD-10-CM | POA: Diagnosis not present

## 2021-07-25 DIAGNOSIS — M9901 Segmental and somatic dysfunction of cervical region: Secondary | ICD-10-CM | POA: Diagnosis not present

## 2021-07-25 DIAGNOSIS — M9908 Segmental and somatic dysfunction of rib cage: Secondary | ICD-10-CM | POA: Diagnosis not present

## 2021-10-04 DIAGNOSIS — M549 Dorsalgia, unspecified: Secondary | ICD-10-CM | POA: Diagnosis not present

## 2021-10-04 DIAGNOSIS — G4733 Obstructive sleep apnea (adult) (pediatric): Secondary | ICD-10-CM | POA: Diagnosis not present

## 2021-10-13 DIAGNOSIS — G4733 Obstructive sleep apnea (adult) (pediatric): Secondary | ICD-10-CM | POA: Diagnosis not present

## 2021-11-01 ENCOUNTER — Other Ambulatory Visit: Payer: Self-pay | Admitting: Internal Medicine

## 2021-11-01 DIAGNOSIS — M549 Dorsalgia, unspecified: Secondary | ICD-10-CM

## 2021-11-01 DIAGNOSIS — M546 Pain in thoracic spine: Secondary | ICD-10-CM

## 2021-11-02 ENCOUNTER — Ambulatory Visit
Admission: RE | Admit: 2021-11-02 | Discharge: 2021-11-02 | Disposition: A | Discharge status: Home / Self Care | Payer: 59 | Source: Ambulatory Visit | Attending: Internal Medicine | Admitting: Internal Medicine

## 2021-11-02 DIAGNOSIS — M549 Dorsalgia, unspecified: Secondary | ICD-10-CM

## 2021-11-02 DIAGNOSIS — M546 Pain in thoracic spine: Secondary | ICD-10-CM

## 2021-11-02 IMAGING — MR MR THORACIC SPINE W/O CM
4 of 5 series · 21 of 48 positions shown · non-contrast
Comparison: None Available.

CLINICAL DATA: Mid back pain.

EXAM:
MRI THORACIC SPINE WITHOUT CONTRAST
TECHNIQUE: Multiplanar, multisequence MR imaging of the thoracic spine was
performed. No intravenous contrast was administered.

[Series 3: T1 · sagittal · 3.0mm · 1.06mm/px · 3 of 7 slices shown]
[im 1/7]
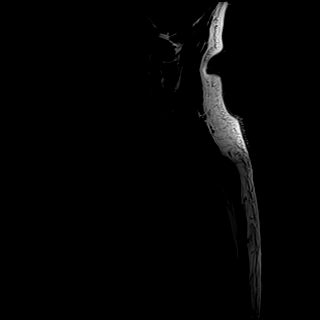
[im 5/7]
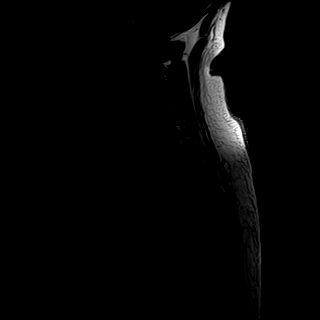
[im 7/7]
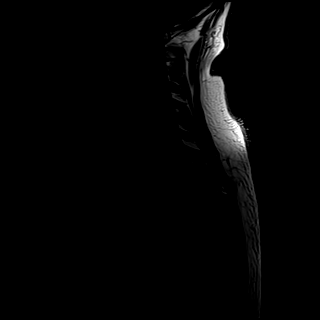

[Series 4: T2 · sagittal · 4.0mm · 0.53mm/px · 6 of 16 slices shown (1 of 3)]
[im 1/16]
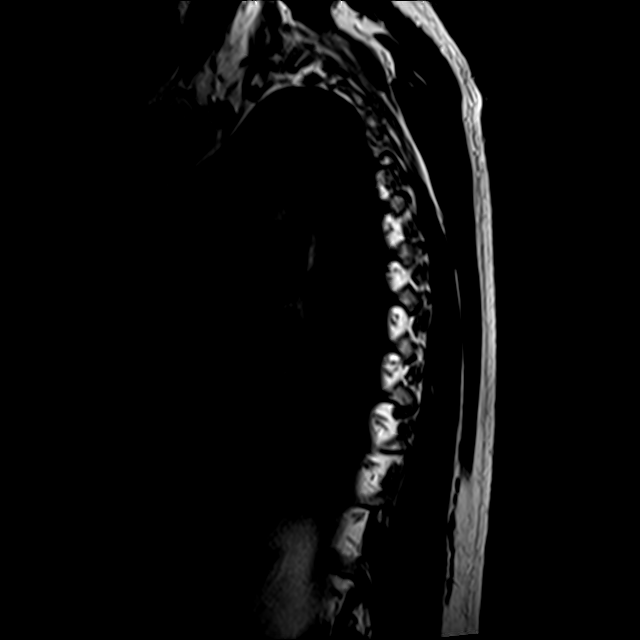
[im 4/16]
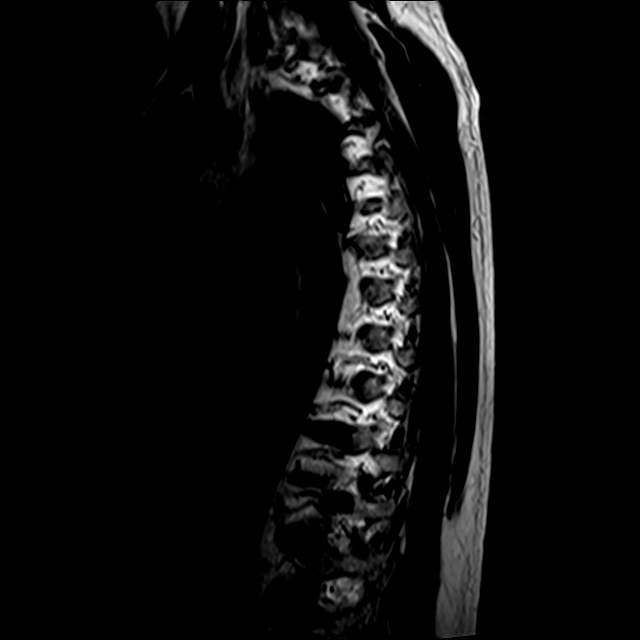
[im 7/16]
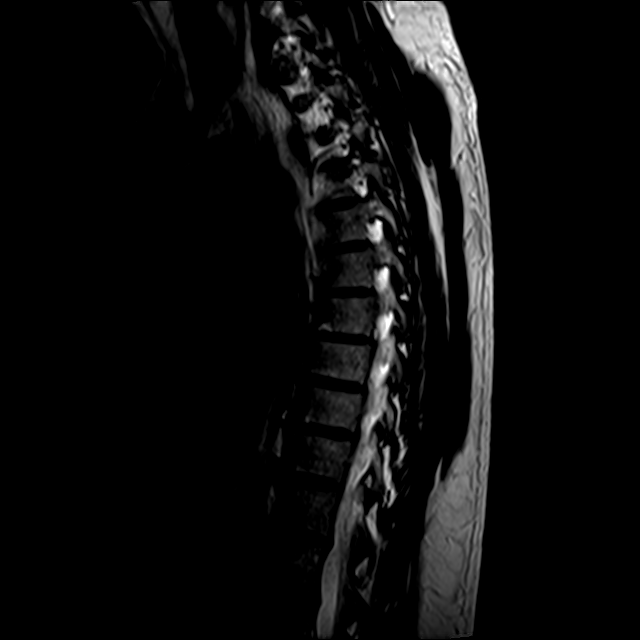
[im 10/16]
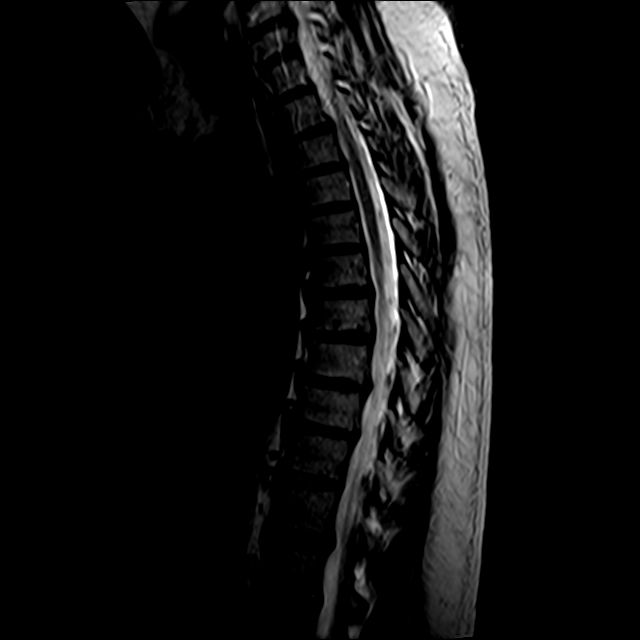
[im 13/16]
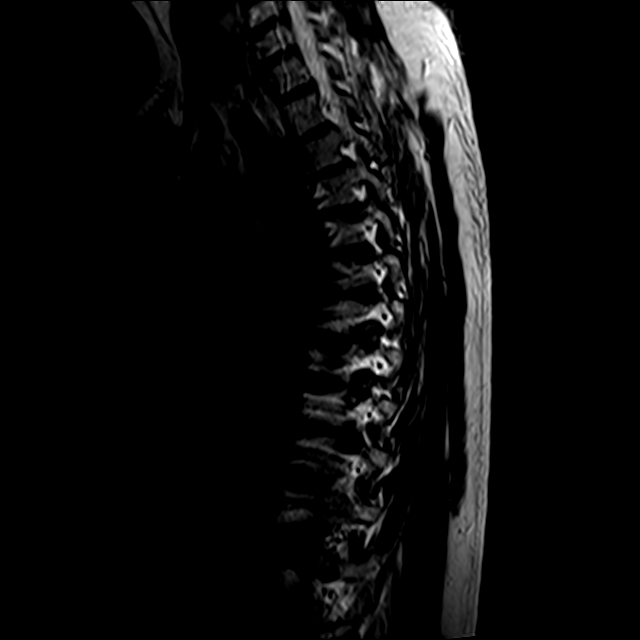
[im 16/16]
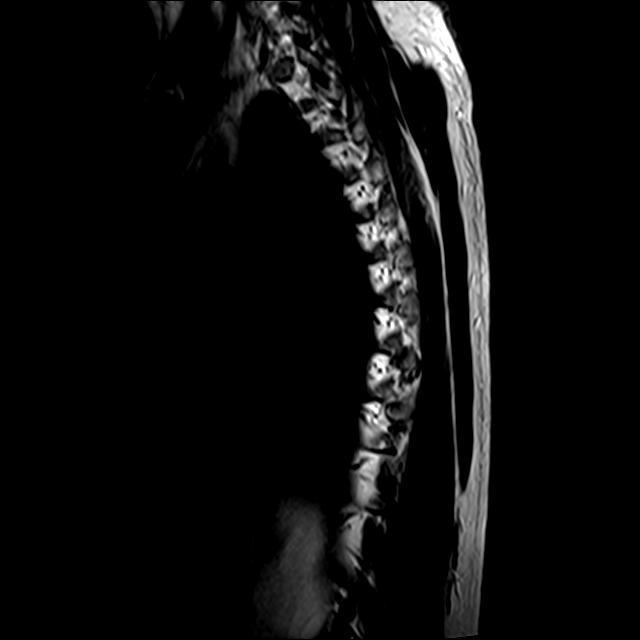

[Series 6: T2 · axial · 4.0mm · 0.39mm/px · z∈[-312,-93]mm · 9 of 40 slices shown (2 of 3)]
[im 3/40]
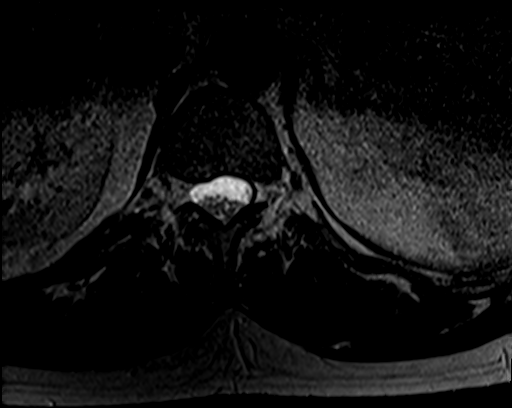
[im 6/40]
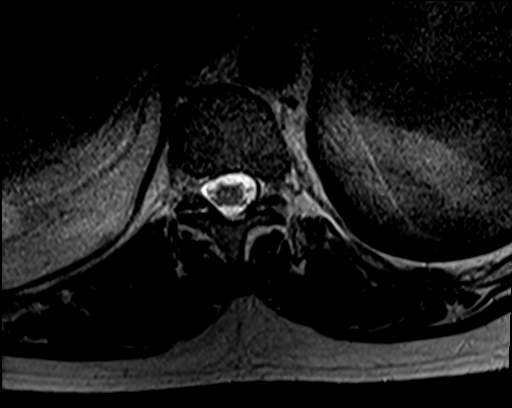
[im 8/40]
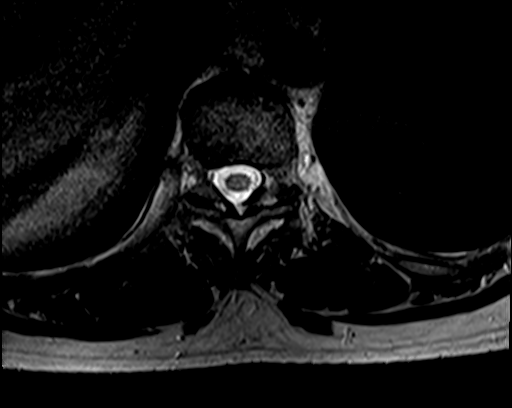
[im 14/40]
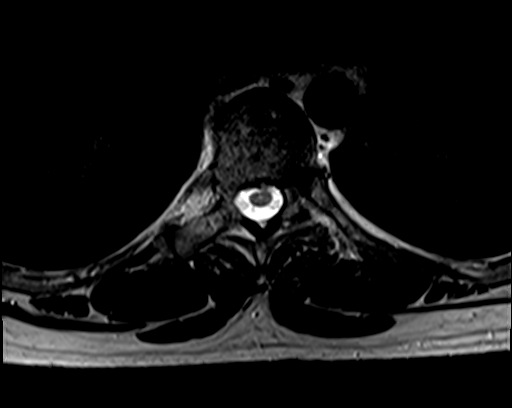
[im 19/40]
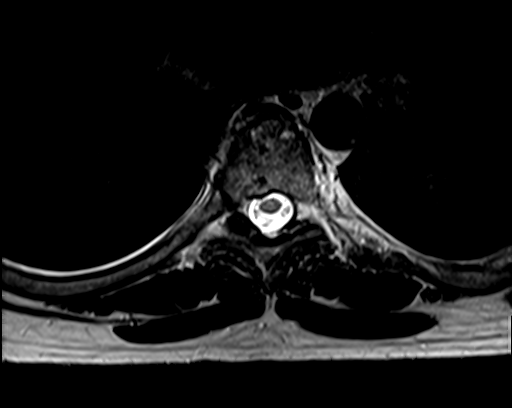
[im 21/40]
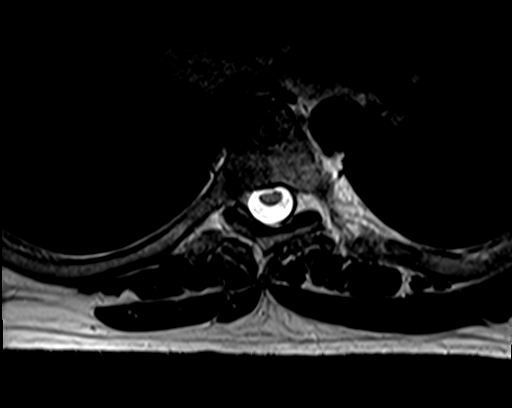
[im 24/40]
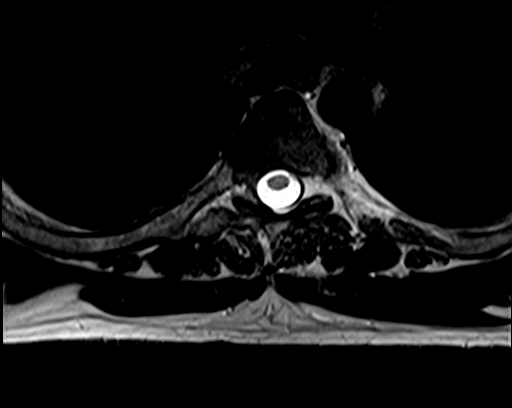
[im 29/40]
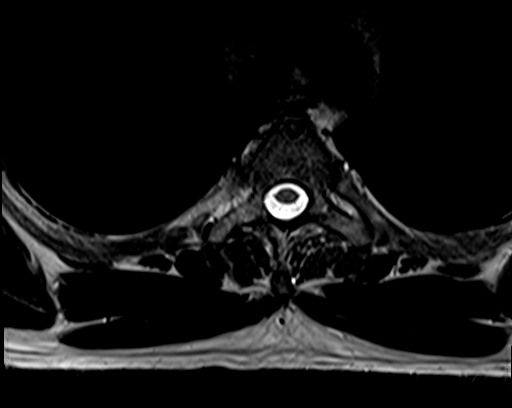
[im 34/40]
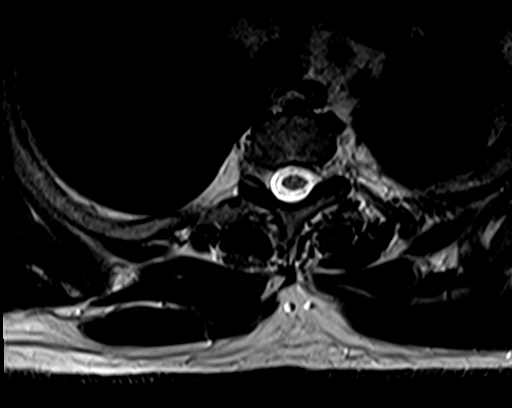

[Series 7: T2 · axial · 4.0mm · 0.39mm/px · z∈[-278,-93]mm · 3 of 40 slices shown (3 of 3)]
[im 6/40]
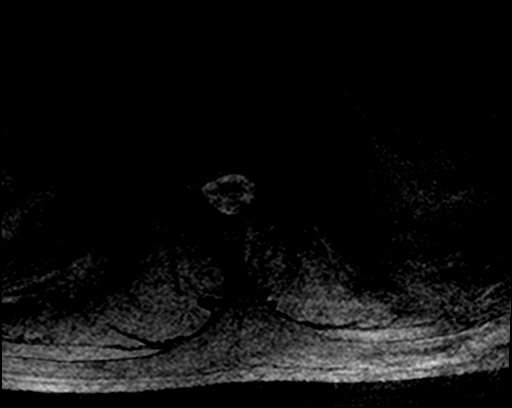
[im 21/40]
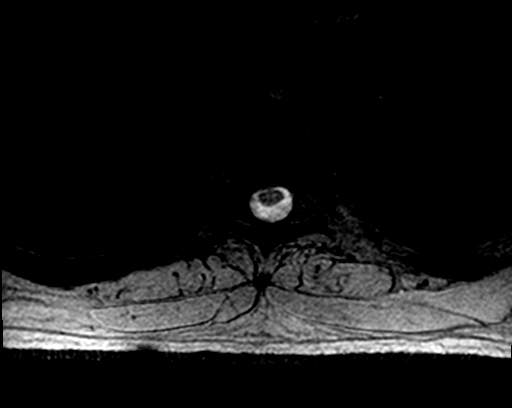
[im 34/40]
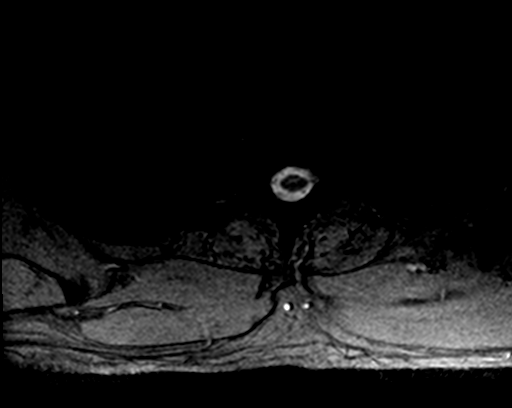

[21 of 48 positions shown; findings below may reference images not displayed]

FINDINGS: Alignment:  No substantial sagittal subluxation.

Vertebrae: Vertebral body heights are maintained. Mild
degenerative/discogenic endplate signal changes about the right
eccentric T7-T8 disc. No specific evidence of acute fracture
discitis/osteomyelitis.

Cord:  Normal cord signal.

Paraspinal and other soft tissues: Unremarkable.

Disc levels:

Small posterior disc/osteophyte complexes efface ventral CSF and
flattens the ventral cord at T6-T7, T7-T8. Central disc protrusion
contacts and flattens the ventral cord at T8-T9. no significant
canal stenosis. Mild multilevel foraminal stenosis, greatest on the
left at T10-T11.
IMPRESSION: 1. Posterior disc and osteophytes contact and flatten the ventral
cord at multiple midthoracic levels, described above. No significant
canal stenosis.
2. Mild multilevel foraminal stenosis, greatest on the left at
T10-T11.

## 2021-11-03 ENCOUNTER — Other Ambulatory Visit: Payer: Self-pay | Admitting: Internal Medicine

## 2021-11-03 DIAGNOSIS — M542 Cervicalgia: Secondary | ICD-10-CM

## 2021-11-03 DIAGNOSIS — M545 Low back pain, unspecified: Secondary | ICD-10-CM

## 2021-11-05 ENCOUNTER — Ambulatory Visit
Admission: RE | Admit: 2021-11-05 | Discharge: 2021-11-05 | Disposition: A | Discharge status: Home / Self Care | Payer: 59 | Source: Ambulatory Visit | Attending: Internal Medicine | Admitting: Internal Medicine

## 2021-11-05 DIAGNOSIS — M542 Cervicalgia: Secondary | ICD-10-CM

## 2021-11-05 DIAGNOSIS — M545 Low back pain, unspecified: Secondary | ICD-10-CM

## 2021-11-05 IMAGING — MR MR CERVICAL SPINE W/O CM
4 of 5 series · 29 of 48 positions shown · non-contrast
Comparison: None Available.

CLINICAL DATA: Initial evaluation for chronic neck pain.

EXAM:
MRI CERVICAL SPINE WITHOUT CONTRAST
TECHNIQUE: Multiplanar, multisequence MR imaging of the cervical spine was
performed. No intravenous contrast was administered.

[Series 2: T2 · sagittal · 3.0mm · 0.66mm/px · 8 of 15 slices shown (1 of 2)]
[im 1/15]
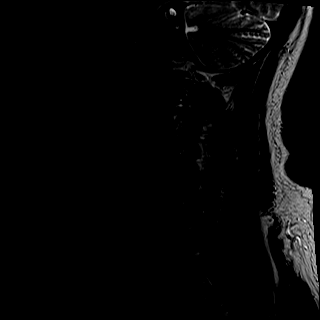
[im 3/15]
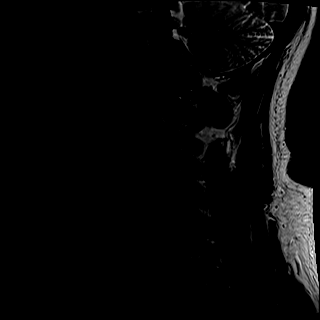
[im 5/15]
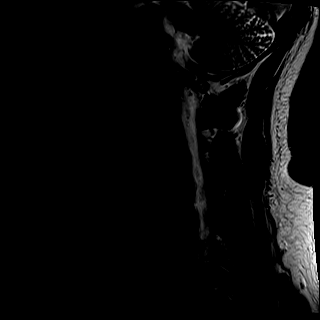
[im 7/15]
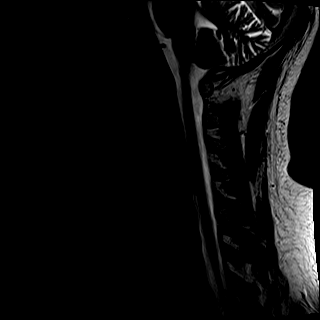
[im 9/15]
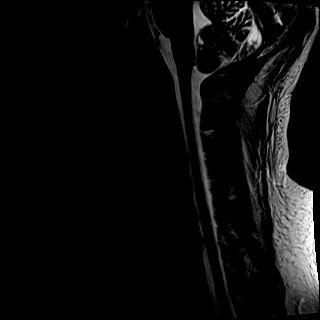
[im 11/15]
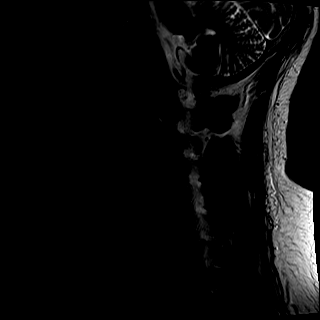
[im 13/15]
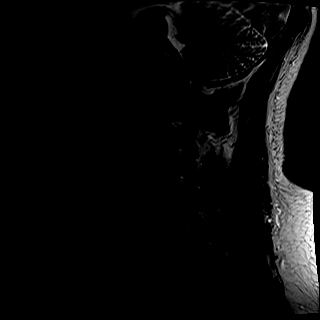
[im 15/15]
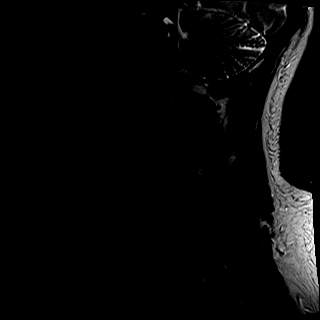

[Series 3: T1 · sagittal · 3.0mm · 0.41mm/px · 7 of 15 slices shown]
[im 1/15]
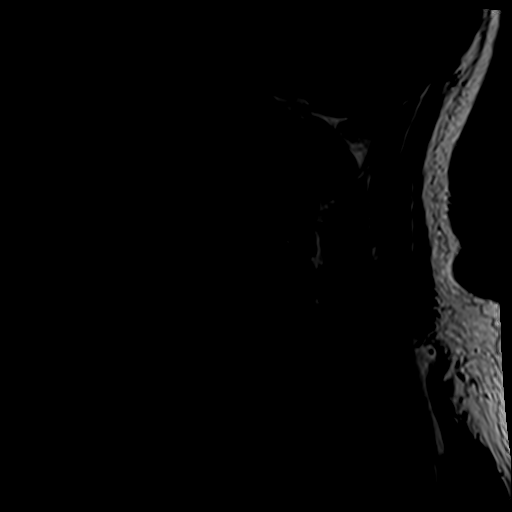
[im 3/15]
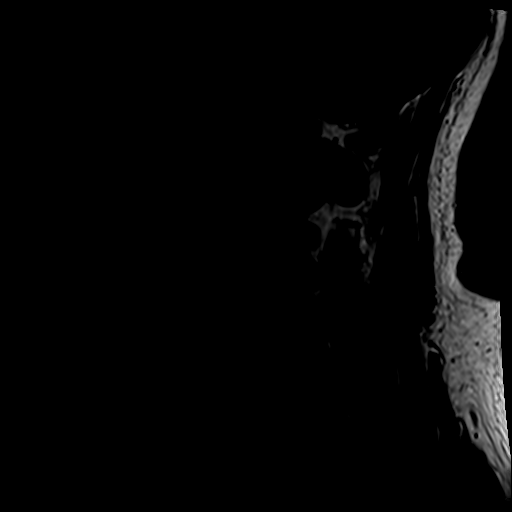
[im 5/15]
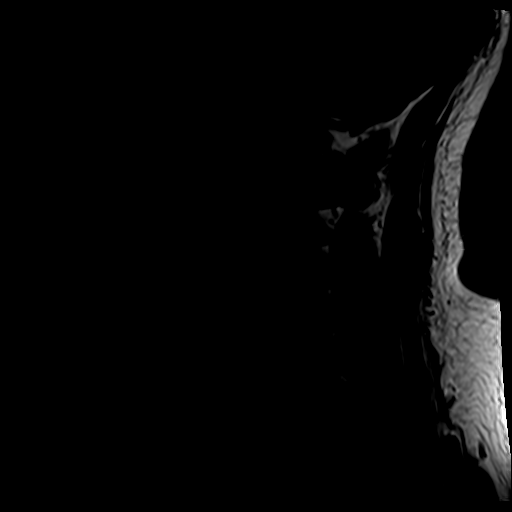
[im 8/15]
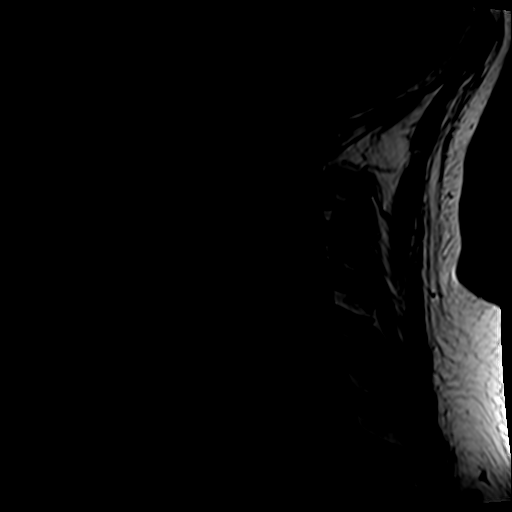
[im 10/15]
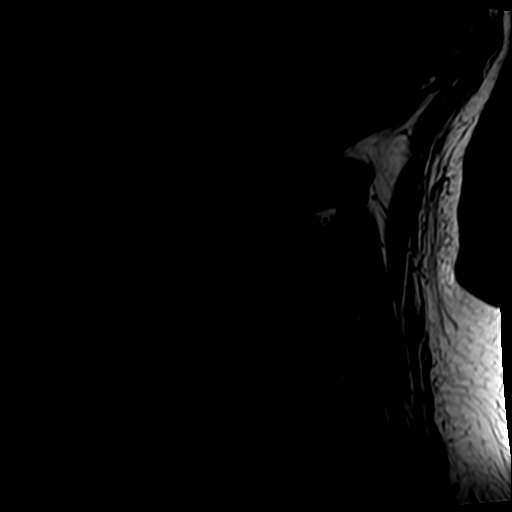
[im 12/15]
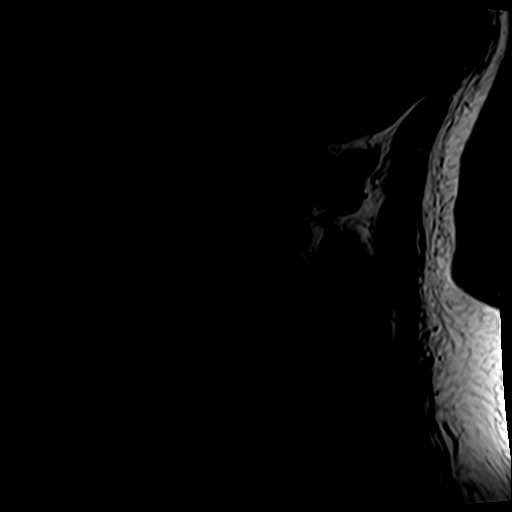
[im 15/15]
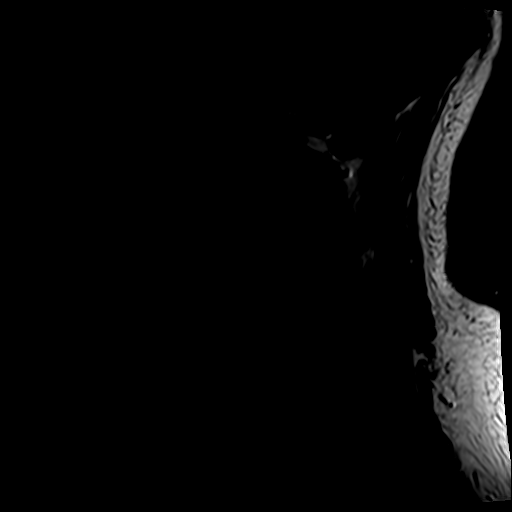

[Series 4: tir sag · sagittal · 3.0mm · 0.41mm/px · 5 of 15 slices shown]
[im 1/15]
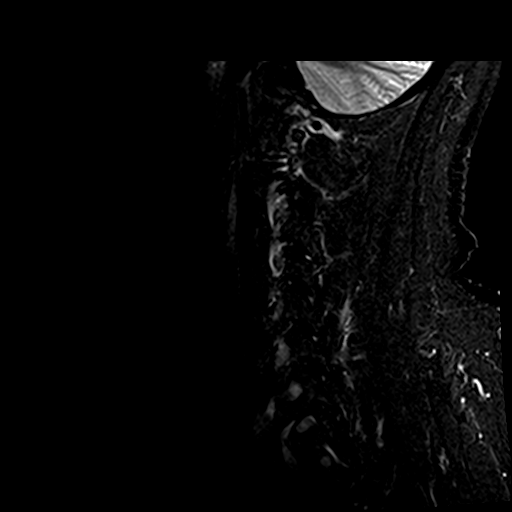
[im 3/15]
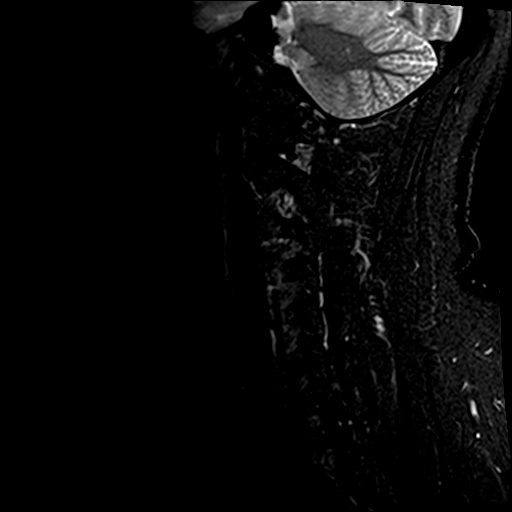
[im 5/15]
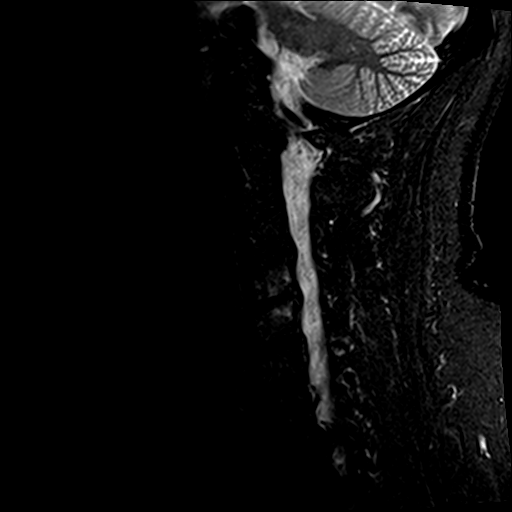
[im 8/15]
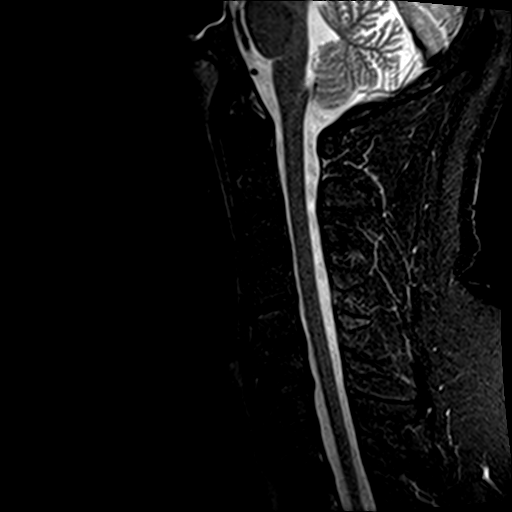
[im 12/15]
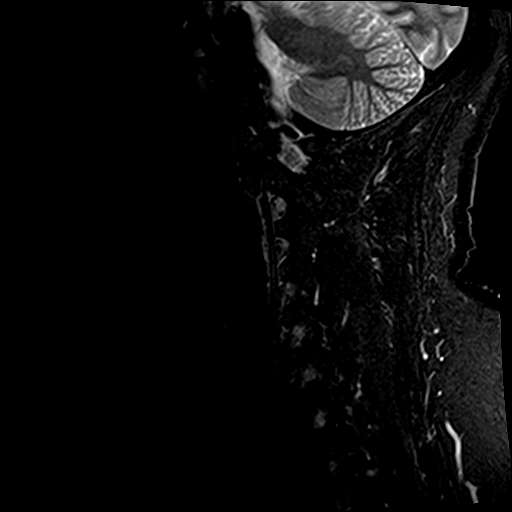

[Series 6: T2 · axial · 3.0mm · 0.70mm/px · z∈[-52,+45]mm · 9 of 27 slices shown (2 of 2)]
[im 1/27]
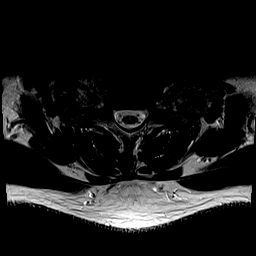
[im 5/27]
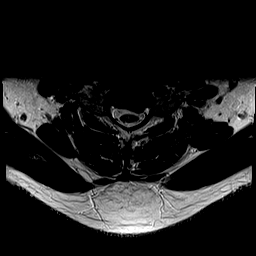
[im 9/27]
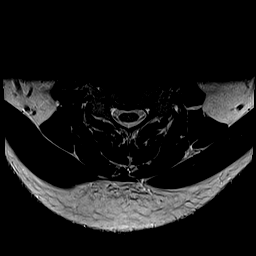
[im 11/27]
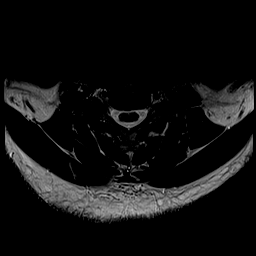
[im 14/27]
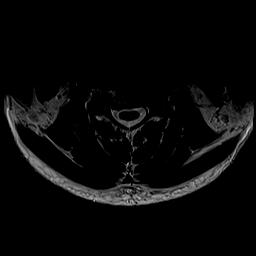
[im 16/27]
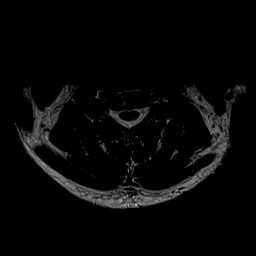
[im 18/27]
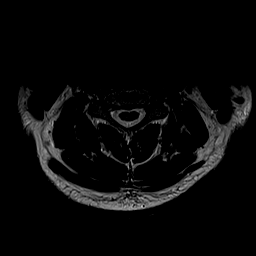
[im 22/27]
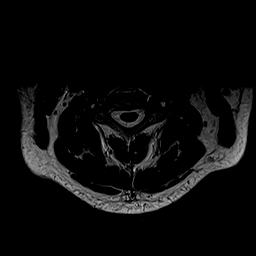
[im 27/27]
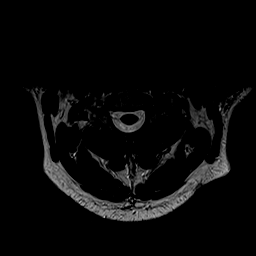

[29 of 48 positions shown; findings below may reference images not displayed]

FINDINGS: Alignment: Straightening of the normal cervical lordosis. No
listhesis.

Vertebrae: Vertebral body height maintained without acute or chronic
fracture. Bone marrow signal intensity within normal limits. No
worrisome osseous lesions. Discogenic reactive endplate change with
mild marrow edema present about the C4-5 interspace. No other
abnormal marrow edema.

Cord: Normal signal and morphology.

Posterior Fossa, vertebral arteries, paraspinal tissues:
Unremarkable.

Disc levels:

C2-C3: Unremarkable.

C3-C4: Mild right-sided uncovertebral spurring without significant
disc bulge. No spinal stenosis. Foramina remain patent.

C4-C5: Mild disc bulge with right greater than left uncovertebral
spurring. Associated reactive endplate marrow edema. Mild flattening
of the ventral thecal sac without significant spinal stenosis. Mild
to moderate right C5 foraminal stenosis. Left neural foramina
remains patent.

C5-C6: Shallow right paracentral disc protrusion mildly indents the
ventral thecal sac (series 5, image 18). No significant spinal
stenosis or frank cord impingement. Foramina remain patent.

C6-C7: Left paracentral to foraminal disc protrusion indents the
left ventral thecal sac (series 5, image 23). No significant spinal
stenosis or frank cord impingement. Superimposed uncovertebral
spurring with resultant mild to moderate left with mild right C7
foraminal stenosis.

C7-T1: Minimal disc bulge with uncovertebral spurring. No canal or
foraminal stenosis.

Visualized upper thoracic spine demonstrates no significant finding.
IMPRESSION: 1. Left paracentral to foraminal disc protrusion with uncovertebral
spurring at C6-7 with resultant mild to moderate left and mild right
C7 foraminal stenosis.
2. Shallow right paracentral disc protrusion at C5-6 without
significant stenosis or impingement.
3. Disc bulge with uncovertebral spurring at C4-5 with resultant
mild to moderate right C5 foraminal stenosis.
4. Discogenic reactive endplate change with mild marrow edema about
the C4-5 interspace. Finding could contribute to underlying neck
pain.

## 2022-08-16 DIAGNOSIS — G4733 Obstructive sleep apnea (adult) (pediatric): Secondary | ICD-10-CM | POA: Diagnosis not present

## 2022-09-16 DIAGNOSIS — G4733 Obstructive sleep apnea (adult) (pediatric): Secondary | ICD-10-CM | POA: Diagnosis not present

## 2022-10-16 DIAGNOSIS — G4733 Obstructive sleep apnea (adult) (pediatric): Secondary | ICD-10-CM | POA: Diagnosis not present

## 2022-10-25 DIAGNOSIS — F332 Major depressive disorder, recurrent severe without psychotic features: Secondary | ICD-10-CM | POA: Diagnosis not present

## 2022-10-25 DIAGNOSIS — F1020 Alcohol dependence, uncomplicated: Secondary | ICD-10-CM | POA: Diagnosis not present

## 2022-10-25 DIAGNOSIS — I10 Essential (primary) hypertension: Secondary | ICD-10-CM | POA: Diagnosis not present

## 2022-11-14 DIAGNOSIS — G4733 Obstructive sleep apnea (adult) (pediatric): Secondary | ICD-10-CM | POA: Diagnosis not present

## 2022-11-16 DIAGNOSIS — G4733 Obstructive sleep apnea (adult) (pediatric): Secondary | ICD-10-CM | POA: Diagnosis not present

## 2022-12-14 DIAGNOSIS — G4733 Obstructive sleep apnea (adult) (pediatric): Secondary | ICD-10-CM | POA: Diagnosis not present

## 2023-01-14 DIAGNOSIS — G4733 Obstructive sleep apnea (adult) (pediatric): Secondary | ICD-10-CM | POA: Diagnosis not present

## 2023-02-12 DIAGNOSIS — G4733 Obstructive sleep apnea (adult) (pediatric): Secondary | ICD-10-CM | POA: Diagnosis not present

## 2023-02-19 DIAGNOSIS — G4733 Obstructive sleep apnea (adult) (pediatric): Secondary | ICD-10-CM | POA: Diagnosis not present

## 2023-03-14 DIAGNOSIS — G4733 Obstructive sleep apnea (adult) (pediatric): Secondary | ICD-10-CM | POA: Diagnosis not present

## 2023-04-14 DIAGNOSIS — G4733 Obstructive sleep apnea (adult) (pediatric): Secondary | ICD-10-CM | POA: Diagnosis not present

## 2023-05-06 DIAGNOSIS — Z125 Encounter for screening for malignant neoplasm of prostate: Secondary | ICD-10-CM | POA: Diagnosis not present

## 2023-05-06 DIAGNOSIS — R7989 Other specified abnormal findings of blood chemistry: Secondary | ICD-10-CM | POA: Diagnosis not present

## 2023-05-13 DIAGNOSIS — M549 Dorsalgia, unspecified: Secondary | ICD-10-CM | POA: Diagnosis not present

## 2023-05-13 DIAGNOSIS — Z0000 Encounter for general adult medical examination without abnormal findings: Secondary | ICD-10-CM | POA: Diagnosis not present

## 2023-05-13 DIAGNOSIS — G4733 Obstructive sleep apnea (adult) (pediatric): Secondary | ICD-10-CM | POA: Diagnosis not present

## 2023-05-13 DIAGNOSIS — Z1331 Encounter for screening for depression: Secondary | ICD-10-CM | POA: Diagnosis not present

## 2023-05-13 DIAGNOSIS — Z1389 Encounter for screening for other disorder: Secondary | ICD-10-CM | POA: Diagnosis not present

## 2023-05-13 DIAGNOSIS — I10 Essential (primary) hypertension: Secondary | ICD-10-CM | POA: Diagnosis not present

## 2023-05-16 DIAGNOSIS — N390 Urinary tract infection, site not specified: Secondary | ICD-10-CM | POA: Diagnosis not present

## 2023-05-21 DIAGNOSIS — G4733 Obstructive sleep apnea (adult) (pediatric): Secondary | ICD-10-CM | POA: Diagnosis not present

## 2023-05-27 DIAGNOSIS — M62830 Muscle spasm of back: Secondary | ICD-10-CM | POA: Diagnosis not present

## 2023-05-27 DIAGNOSIS — I10 Essential (primary) hypertension: Secondary | ICD-10-CM | POA: Diagnosis not present

## 2023-05-27 DIAGNOSIS — M549 Dorsalgia, unspecified: Secondary | ICD-10-CM | POA: Diagnosis not present

## 2023-06-04 DIAGNOSIS — M5416 Radiculopathy, lumbar region: Secondary | ICD-10-CM | POA: Diagnosis not present

## 2023-06-14 DIAGNOSIS — M545 Low back pain, unspecified: Secondary | ICD-10-CM | POA: Diagnosis not present

## 2023-08-21 ENCOUNTER — Other Ambulatory Visit: Payer: Self-pay | Admitting: Urology

## 2023-09-09 NOTE — Patient Instructions (Signed)
 SURGICAL WAITING ROOM VISITATION  Patients having surgery or a procedure may have no more than 2 support people in the waiting area - these visitors may rotate.    Children under the age of 46 must have an adult with them who is not the patient.  Due to an increase in RSV and influenza rates and associated hospitalizations, children ages 70 and under may not visit patients in [facility] hospitals.  Visitors with respiratory illnesses are discouraged from visiting and should remain at home.  If the patient needs to stay at the hospital during part of their recovery, the visitor guidelines for inpatient rooms apply. Pre-op nurse will coordinate an appropriate time for 1 support person to accompany patient in pre-op.  This support person may not rotate.    Please refer to the [facility] website for the visitor guidelines for Inpatients (after your surgery is over and you are in a regular room).       Your procedure is scheduled on: 09/20/23   Report to [facility] Main Entrance    Report to admitting at 8 AM   Call this number if you have problems the morning of surgery [phone]   Do not eat food or drink liquids :After Midnight. But may have sips of water with meds        Oral Hygiene is also important to reduce your risk of infection.                                    Remember - BRUSH YOUR TEETH THE MORNING OF SURGERY WITH YOUR REGULAR TOOTHPASTE   Stop all vitamins and herbal supplements 7 days before surgery.   Take these medicines the morning of surgery with A SIP OF WATER: Amlodipine, Atenolol, Lexapro(escitaprolam)             You may not have any metal on your body including hair pins, jewelry, and body piercing             Do not wear make-up, lotions, powders, perfumes/cologne, or deodorant  Do not wear nail polish including gel and S&S, artificial/acrylic nails, or any other type of covering on natural nails including finger and toenails. If you have  artificial nails, gel coating, etc. that needs to be removed by a nail salon please have this removed prior to surgery or surgery may need to be canceled/ delayed if the surgeon/ anesthesia feels like they are unable to be safely monitored.   Do not shave  48 hours prior to surgery.               Men may shave face and neck.   Do not bring valuables to the hospital. [facility] IS NOT             RESPONSIBLE   FOR VALUABLES.   Contacts, glasses, dentures or bridgework may not be worn into surgery.  DO NOT BRING YOUR HOME MEDICATIONS TO THE HOSPITAL. PHARMACY WILL DISPENSE MEDICATIONS LISTED ON YOUR MEDICATION LIST TO YOU DURING YOUR ADMISSION IN THE HOSPITAL!    Patients discharged on the day of surgery will not be allowed to drive home.  Someone NEEDS to stay with you for the first 24 hours after anesthesia.   Special Instructions: Bring a copy of your healthcare power of attorney and living will documents the day of surgery if you haven't scanned them before.  Please read over the following fact sheets you were given: IF YOU HAVE QUESTIONS ABOUT YOUR PRE-OP INSTRUCTIONS PLEASE CALL [phone] Sergio Barr   If you received a COVID test during your pre-op visit  it is requested that you wear a mask when out in public, stay away from anyone that may not be feeling well and notify your surgeon if you develop symptoms. If you test positive for Covid or have been in contact with anyone that has tested positive in the last 10 days please notify you surgeon.    [facility] - Preparing for Surgery Before surgery, you can play an important role.  Because skin is not sterile, your skin needs to be as free of germs as possible.  You can reduce the number of germs on your skin by washing with CHG (chlorahexidine gluconate) soap before surgery.  CHG is an antiseptic cleaner which kills germs and bonds with the skin to continue killing germs even after washing. Please DO NOT use if you have an  allergy to CHG or antibacterial soaps.  If your skin becomes reddened/irritated stop using the CHG and inform your nurse when you arrive at Short Stay. Do not shave (including legs and underarms) for at least 48 hours prior to the first CHG shower.  You may shave your face/neck.  Please follow these instructions carefully:  1.  Shower with CHG Soap the night before surgery and the  morning of surgery.  2.  If you choose to wash your hair, wash your hair first as usual with your normal  shampoo.  3.  After you shampoo, rinse your hair and body thoroughly to remove the shampoo.                             4.  Use CHG as you would any other liquid soap.  You can apply chg directly to the skin and wash.  Gently with a scrungie or clean washcloth.  5.  Apply the CHG Soap to your body ONLY FROM THE NECK DOWN.   Do   not use on face/ open                           Wound or open sores. Avoid contact with eyes, ears mouth and   genitals (private parts).                       Wash face,  Genitals (private parts) with your normal soap.             6.  Wash thoroughly, paying special attention to the area where your    surgery  will be performed.  7.  Thoroughly rinse your body with warm water from the neck down.  8.  DO NOT shower/wash with your normal soap after using and rinsing off the CHG Soap.                9.  Pat yourself dry with a clean towel.            10.  Wear clean pajamas.            11.  Place clean sheets on your bed the night of your first shower and do not  sleep with pets. Day of Surgery : Do not apply any lotions/deodorants the morning of surgery.  Please wear clean clothes to the hospital/surgery center.  FAILURE TO FOLLOW THESE INSTRUCTIONS MAY RESULT IN THE CANCELLATION OF YOUR SURGERY  PATIENT SIGNATURE_________________________________  NURSE SIGNATURE__________________________________  ________________________________________________________________________

## 2023-09-09 NOTE — Progress Notes (Signed)
 COVID Vaccine received:  []  No [x]  Yes Date of any COVID positive Test in last 90 days: no PCP - Windell Hasty DO Cardiologist - n/a  Chest x-ray -  EKG -  09/10/23 Epic Stress Test -  ECHO -  Cardiac Cath -   Bowel Prep - [x]  No  []   Yes ______  Pacemaker / ICD device [x]  No []  Yes   Spinal Cord Stimulator:[x]  No []  Yes       History of Sleep Apnea? []  No [x]  Yes   CPAP used?- []  No [x]  Yes    Does the patient monitor blood sugar?          [x]  No []  Yes  []  N/A  Patient has: [x]  NO Hx DM   []  Pre-DM                 []  DM1  []   DM2 Does patient have a [employer] or [employer]? []  No []  Yes   Fasting Blood Sugar Ranges-  Checks Blood Sugar _____ times a day  GLP1 agonist / usual dose - no GLP1 instructions:  SGLT-2 inhibitors / usual dose - no SGLT-2 instructions:   Blood Thinner / Instructions:no Aspirin Instructions:no  Comments:   Activity level: Patient is able o climb a flight of stairs without difficulty; [x]  No CP  [x]  No SOB, _   Patient can perform ADLs without assistance.   Anesthesia review:   Patient denies shortness of breath, fever, cough and chest pain at PAT appointment.  Patient verbalized understanding and agreement to the Pre-Surgical Instructions that were given to them at this PAT appointment. Patient was also educated of the need to review these PAT instructions again prior to his/her surgery.I reviewed the appropriate phone numbers to call if they have any and questions or concerns.

## 2023-09-10 ENCOUNTER — Encounter (HOSPITAL_COMMUNITY): Payer: Self-pay

## 2023-09-10 ENCOUNTER — Other Ambulatory Visit: Payer: Self-pay

## 2023-09-10 ENCOUNTER — Encounter (HOSPITAL_COMMUNITY)
Admission: RE | Admit: 2023-09-10 | Discharge: 2023-09-10 | Disposition: A | Discharge status: Home / Self Care | Source: Ambulatory Visit | Attending: Urology | Admitting: Urology

## 2023-09-10 VITALS — BP 136/100 | HR 63 | Temp 98.4°F | Resp 18 | Ht 72.0 in | Wt 289.0 lb

## 2023-09-10 DIAGNOSIS — I10 Essential (primary) hypertension: Secondary | ICD-10-CM

## 2023-09-10 DIAGNOSIS — Z01818 Encounter for other preprocedural examination: Secondary | ICD-10-CM

## 2023-09-10 HISTORY — DX: Unspecified osteoarthritis, unspecified site: M19.90

## 2023-09-10 HISTORY — DX: Headache, unspecified: R51.9

## 2023-09-10 HISTORY — DX: Anxiety disorder, unspecified: F41.9

## 2023-09-10 HISTORY — DX: Depression, unspecified: F32.A

## 2023-09-10 LAB — CBC
HCT: 50.5 % (ref 39.0–52.0)
Hemoglobin: 16 g/dL (ref 13.0–17.0)
MCH: 30.2 pg (ref 26.0–34.0)
MCHC: 31.7 g/dL (ref 30.0–36.0)
MCV: 95.3 fL (ref 80.0–100.0)
Platelets: 237 10*3/uL (ref 150–400)
RBC: 5.3 MIL/uL (ref 4.22–5.81)
RDW: 12.3 % (ref 11.5–15.5)
WBC: 7.2 10*3/uL (ref 4.0–10.5)
nRBC: 0 % (ref 0.0–0.2)

## 2023-09-10 LAB — BASIC METABOLIC PANEL WITH GFR
Anion gap: 9 (ref 5–15)
BUN: 17 mg/dL (ref 6–20)
CO2: 27 mmol/L (ref 22–32)
Calcium: 9.3 mg/dL (ref 8.9–10.3)
Chloride: 101 mmol/L (ref 98–111)
Creatinine, Ser: 0.96 mg/dL (ref 0.61–1.24)
GFR, Estimated: >60 mL/min (ref >60)
Glucose, Bld: 114 mg/dL — ABNORMAL HIGH (ref 70–99)
Potassium: 4.3 mmol/L (ref 3.5–5.1)
Sodium: 137 mmol/L (ref 135–145)

## 2023-09-20 ENCOUNTER — Encounter (HOSPITAL_COMMUNITY): Payer: Self-pay | Admitting: Urology

## 2023-09-20 ENCOUNTER — Other Ambulatory Visit: Payer: Self-pay

## 2023-09-20 ENCOUNTER — Ambulatory Visit (HOSPITAL_COMMUNITY): Payer: Self-pay

## 2023-09-20 ENCOUNTER — Encounter (HOSPITAL_COMMUNITY)
Admission: RE | Disposition: A | Discharge status: Home / Self Care | Payer: Self-pay | Source: Ambulatory Visit | Attending: Urology

## 2023-09-20 ENCOUNTER — Ambulatory Visit (HOSPITAL_BASED_OUTPATIENT_CLINIC_OR_DEPARTMENT_OTHER): Payer: Self-pay

## 2023-09-20 ENCOUNTER — Ambulatory Visit (HOSPITAL_COMMUNITY)
Admission: RE | Admit: 2023-09-20 | Discharge: 2023-09-20 | Disposition: A | Discharge status: Home / Self Care | Source: Ambulatory Visit | Attending: Urology | Admitting: Urology

## 2023-09-20 DIAGNOSIS — N3081 Other cystitis with hematuria: Secondary | ICD-10-CM | POA: Insufficient documentation

## 2023-09-20 DIAGNOSIS — N3289 Other specified disorders of bladder: Secondary | ICD-10-CM

## 2023-09-20 DIAGNOSIS — N400 Enlarged prostate without lower urinary tract symptoms: Secondary | ICD-10-CM | POA: Diagnosis not present

## 2023-09-20 DIAGNOSIS — D494 Neoplasm of unspecified behavior of bladder: Secondary | ICD-10-CM

## 2023-09-20 DIAGNOSIS — F418 Other specified anxiety disorders: Secondary | ICD-10-CM | POA: Diagnosis not present

## 2023-09-20 DIAGNOSIS — K219 Gastro-esophageal reflux disease without esophagitis: Secondary | ICD-10-CM | POA: Diagnosis not present

## 2023-09-20 DIAGNOSIS — I10 Essential (primary) hypertension: Secondary | ICD-10-CM | POA: Diagnosis not present

## 2023-09-20 HISTORY — PX: TRANSURETHRAL RESECTION OF BLADDER TUMOR: SHX2575

## 2023-09-20 SURGERY — TURBT (TRANSURETHRAL RESECTION OF BLADDER TUMOR)
Anesthesia: General | Site: Bladder

## 2023-09-20 MED ORDER — ONDANSETRON HCL 4 MG/2ML IJ SOLN
INTRAMUSCULAR | Status: AC
  Filled 2023-09-20: qty 2

## 2023-09-20 MED ORDER — ORAL CARE MOUTH RINSE: 15.0000 mL | Freq: Once | OROMUCOSAL | Status: AC

## 2023-09-20 MED ORDER — LIDOCAINE HCL (CARDIAC) PF 100 MG/5ML IV SOSY
PREFILLED_SYRINGE | INTRAVENOUS | Status: DC | PRN
  Administered 2023-09-20: 80 mg via INTRAVENOUS

## 2023-09-20 MED ORDER — LACTATED RINGERS IV SOLN: INTRAVENOUS | Status: DC | PRN

## 2023-09-20 MED ORDER — PROPOFOL 10 MG/ML IV BOLUS
INTRAVENOUS | Status: AC
  Filled 2023-09-20: qty 20

## 2023-09-20 MED ORDER — CEFAZOLIN SODIUM-DEXTROSE 2-4 GM/100ML-% IV SOLN: 2.0000 g | INTRAVENOUS | Status: DC

## 2023-09-20 MED ORDER — MIDAZOLAM HCL 2 MG/2ML IJ SOLN
INTRAMUSCULAR | Status: AC
  Filled 2023-09-20: qty 2

## 2023-09-20 MED ORDER — FENTANYL CITRATE (PF) 100 MCG/2ML IJ SOLN
INTRAMUSCULAR | Status: AC
Start: 2023-09-20 — End: ?
  Filled 2023-09-20: qty 2

## 2023-09-20 MED ORDER — DEXMEDETOMIDINE HCL IN NACL 200 MCG/50ML IV SOLN
INTRAVENOUS | Status: DC | PRN
  Administered 2023-09-20: 12 ug via INTRAVENOUS
  Administered 2023-09-20: 8 ug via INTRAVENOUS

## 2023-09-20 MED ORDER — OXYCODONE-ACETAMINOPHEN 5-325 MG PO TABS: 1.0000 | ORAL_TABLET | ORAL | 0 refills | Status: AC | PRN

## 2023-09-20 MED ORDER — SODIUM CHLORIDE 0.9 % IR SOLN
Status: DC | PRN
  Administered 2023-09-20: 3000 mL

## 2023-09-20 MED ORDER — DEXAMETHASONE SODIUM PHOSPHATE 10 MG/ML IJ SOLN
INTRAMUSCULAR | Status: DC | PRN
  Administered 2023-09-20: 5 mg via INTRAVENOUS

## 2023-09-20 MED ORDER — CHLORHEXIDINE GLUCONATE 0.12 % MT SOLN
15.0000 mL | Freq: Once | OROMUCOSAL | Status: AC
  Administered 2023-09-20: 15 mL via OROMUCOSAL

## 2023-09-20 MED ORDER — OXYCODONE HCL 5 MG PO TABS: 5.0000 mg | ORAL_TABLET | Freq: Once | ORAL | Status: DC | PRN

## 2023-09-20 MED ORDER — LIDOCAINE HCL (PF) 2 % IJ SOLN
INTRAMUSCULAR | Status: AC
  Filled 2023-09-20: qty 5

## 2023-09-20 MED ORDER — ONDANSETRON HCL 4 MG/2ML IJ SOLN: 4.0000 mg | Freq: Once | INTRAMUSCULAR | Status: DC | PRN

## 2023-09-20 MED ORDER — PROPOFOL 10 MG/ML IV BOLUS
INTRAVENOUS | Status: DC | PRN
  Administered 2023-09-20: 100 mg via INTRAVENOUS
  Administered 2023-09-20: 200 mg via INTRAVENOUS

## 2023-09-20 MED ORDER — OXYCODONE HCL 5 MG/5ML PO SOLN: 5.0000 mg | Freq: Once | ORAL | Status: DC | PRN

## 2023-09-20 MED ORDER — DEXAMETHASONE SODIUM PHOSPHATE 10 MG/ML IJ SOLN
INTRAMUSCULAR | Status: AC
  Filled 2023-09-20: qty 1

## 2023-09-20 MED ORDER — FENTANYL CITRATE PF 50 MCG/ML IJ SOSY: 25.0000 ug | PREFILLED_SYRINGE | INTRAMUSCULAR | Status: DC | PRN

## 2023-09-20 MED ORDER — FENTANYL CITRATE (PF) 100 MCG/2ML IJ SOLN
INTRAMUSCULAR | Status: AC
  Filled 2023-09-20: qty 2

## 2023-09-20 MED ORDER — CEFAZOLIN SODIUM-DEXTROSE 3-4 GM/150ML-% IV SOLN
3.0000 g | INTRAVENOUS | Status: AC
  Administered 2023-09-20: 3 g via INTRAVENOUS
  Filled 2023-09-20: qty 150

## 2023-09-20 MED ORDER — ROCURONIUM BROMIDE 10 MG/ML (PF) SYRINGE
PREFILLED_SYRINGE | INTRAVENOUS | Status: AC
  Filled 2023-09-20: qty 10

## 2023-09-20 MED ORDER — LACTATED RINGERS IV SOLN: INTRAVENOUS | Status: DC

## 2023-09-20 MED ORDER — ACETAMINOPHEN 10 MG/ML IV SOLN: 1000.0000 mg | Freq: Once | INTRAVENOUS | Status: DC | PRN

## 2023-09-20 MED ORDER — STERILE WATER FOR IRRIGATION IR SOLN
Status: DC | PRN
  Administered 2023-09-20: 500 mL

## 2023-09-20 MED ORDER — ONDANSETRON HCL 4 MG/2ML IJ SOLN
INTRAMUSCULAR | Status: DC | PRN
  Administered 2023-09-20: 4 mg via INTRAVENOUS

## 2023-09-20 MED ORDER — MIDAZOLAM HCL 5 MG/5ML IJ SOLN
INTRAMUSCULAR | Status: DC | PRN
  Administered 2023-09-20: 2 mg via INTRAVENOUS

## 2023-09-20 MED ORDER — FENTANYL CITRATE (PF) 100 MCG/2ML IJ SOLN
INTRAMUSCULAR | Status: DC | PRN
  Administered 2023-09-20 (×3): 50 ug via INTRAVENOUS

## 2023-09-20 SURGICAL SUPPLY — 15 items
BAG URINE DRAIN 2000ML AR STRL (UROLOGICAL SUPPLIES) IMPLANT
BAG URO CATCHER STRL LF (MISCELLANEOUS) ×1 IMPLANT
DRAPE FOOT SWITCH (DRAPES) ×1 IMPLANT
ELECT REM PT RETURN 15FT ADLT (MISCELLANEOUS) ×1 IMPLANT
EVACUATOR MICROVAS BLADDER (UROLOGICAL SUPPLIES) IMPLANT
GLOVE BIOGEL M 7.0 STRL (GLOVE) ×1 IMPLANT
GOWN STRL REUS W/ TWL XL LVL3 (GOWN DISPOSABLE) ×1 IMPLANT
HOLDER FOLEY CATH W/STRAP (MISCELLANEOUS) IMPLANT
KIT TURNOVER KIT A (KITS) IMPLANT
LOOP CUT BIPOLAR 24F LRG (ELECTROSURGICAL) IMPLANT
MANIFOLD NEPTUNE II (INSTRUMENTS) ×1 IMPLANT
PACK CYSTO (CUSTOM PROCEDURE TRAY) ×1 IMPLANT
SYRINGE TOOMEY IRRIG 70ML (MISCELLANEOUS) IMPLANT
TUBING CONNECTING 10 (TUBING) ×1 IMPLANT
TUBING UROLOGY SET (TUBING) ×1 IMPLANT

## 2023-09-20 NOTE — Anesthesia Preprocedure Evaluation (Signed)
 Anesthesia Evaluation  Patient identified by MRN, date of birth, ID band Patient awake    Reviewed: Allergy & Precautions, NPO status , Patient's Chart, lab work & pertinent test results, reviewed documented beta blocker date and time   History of Anesthesia Complications Negative for: history of anesthetic complications  Airway Mallampati: IV  TM Distance: >3 FB     Dental no notable dental hx.    Pulmonary neg COPD, neg PE   breath sounds clear to auscultation       Cardiovascular hypertension, (-) CAD, (-) Past MI, (-) Cardiac Stents and (-) CABG (-) dysrhythmias  Rhythm:Regular Rate:Normal     Neuro/Psych  Headaches, neg Seizures PSYCHIATRIC DISORDERS Anxiety Depression       GI/Hepatic ,neg GERD  ,,(+) neg Cirrhosis    substance abuse  marijuana use  Endo/Other    Renal/GU Renal disease     Musculoskeletal  (+) Arthritis ,    Abdominal   Peds  Hematology   Anesthesia Other Findings   Reproductive/Obstetrics                              Anesthesia Physical Anesthesia Plan  ASA: 2  Anesthesia Plan: General   Post-op Pain Management:    Induction: Intravenous  PONV Risk Score and Plan: 2 and Ondansetron  and Dexamethasone   Airway Management Planned: Oral ETT and Video Laryngoscope Planned  Additional Equipment:   Intra-op Plan:   Post-operative Plan: Extubation in OR  Informed Consent: I have reviewed the patients History and Physical, chart, labs and discussed the procedure including the risks, benefits and alternatives for the proposed anesthesia with the patient or authorized representative who has indicated his/her understanding and acceptance.     Dental advisory given  Plan Discussed with: CRNA  Anesthesia Plan Comments:          Anesthesia Quick Evaluation

## 2023-09-20 NOTE — Anesthesia Postprocedure Evaluation (Signed)
 Anesthesia Post Note  Patient: TYRELL SEIFER  Procedure(s) Performed: TURBT (TRANSURETHRAL RESECTION OF BLADDER TUMOR) (Bladder)     Patient location during evaluation: PACU Anesthesia Type: General Level of consciousness: awake and alert Pain management: pain level controlled Vital Signs Assessment: post-procedure vital signs reviewed and stable Respiratory status: spontaneous breathing, nonlabored ventilation, respiratory function stable and patient connected to nasal cannula oxygen Cardiovascular status: blood pressure returned to baseline and stable Postop Assessment: no apparent nausea or vomiting Anesthetic complications: no   No notable events documented.  Last Vitals:  Vitals:   09/20/23 1045 09/20/23 1108  BP: (!) 156/98 (!) 161/108  Pulse: 67 64  Resp: 16   Temp:  36.7 C  SpO2: 98% 98%    Last Pain:  Vitals:   09/20/23 1108  TempSrc:   PainSc: 0-No pain                 Leslye Rast

## 2023-09-20 NOTE — Transfer of Care (Signed)
 Immediate Anesthesia Transfer of Care Note  Patient: Sergio Barr  Procedure(s) Performed: TURBT (TRANSURETHRAL RESECTION OF BLADDER TUMOR) (Bladder)  Patient Location: PACU  Anesthesia Type:General  Level of Consciousness: awake, alert , and oriented  Airway & Oxygen Therapy: Patient Spontanous Breathing and Patient connected to face mask oxygen  Post-op Assessment: Report given to RN and Post -op Vital signs reviewed and stable  Post vital signs: Reviewed and stable  Last Vitals:  Vitals Value Taken Time  BP 159/96 09/20/23 1028  Temp    Pulse 76 09/20/23 1031  Resp 16 09/20/23 1031  SpO2 95 % 09/20/23 1031  Vitals shown include unfiled device data.  Last Pain:  Vitals:   09/20/23 0824  TempSrc:   PainSc: 6       Patients Stated Pain Goal: 4 (09/20/23 0824)  Complications: No notable events documented.

## 2023-09-20 NOTE — H&P (Signed)
 Office Visit Report     08/14/2023   --------------------------------------------------------------------------------   Sergio Barr  MRN: [id number]  DOB: 02/16/1976, 48 year old Male  SSN:    PRIMARY CARE:     REFERRING:  [name], MD  PROVIDER:  Doy Gene, M.D.  LOCATION:  [facility] [phone]     --------------------------------------------------------------------------------   CC/HPI: Sergio Barr is a 48 year old male who is seen in consultation today for microscopic hematuria.   1. Bladder lesion: Patient was seen by his PCP in 12/24 he reports that he was found to have microscopic hematuria. Unfortunate, that urinalysis is unavailable for my review. Urinalysis today is without evidence of blood. He denies history of gross hematuria. He does have a long smoking history of smoking marijuana. He denies family history of urologic malignancy. He denies history of urolithiasis. He has a rare history of urinary infections.  -CT hematuria protocol 08/13/2023 with no evidence of neoplasm, urolithiasis or hydronephrosis. He does have a small benign Bosniak category 1 cyst of which no further follow-up imaging is recommended. Cystoscopy 08/14/2023 with small subcentimeter papillary lesion at the bladder neck at the 8 o'clock position.   He has mild lower urinary tract symptoms and IPSS score 6. In the morning he states he has a weaker flow stream with sensation of bladder emptying. He has 1 time nocturia. IPS score 6. He states that symptoms are not bothersome enough to warrant pharmacologic therapy.   He denies family history of prostate cancer. PSA in 12/24 was 0.5.   Patient currently denies fever, chills, sweats, nausea, vomiting, abdominal or flank pain, gross hematuria or dysuria.   He has a past medical history of GERD, arthritis, hypertension, obstructive back pain.   He is a Education officer, environmental.     ALLERGIES: No Allergies    MEDICATIONS:  Hydrochlorothiazide 25 mg tablet  Omeprazole 40 mg capsule,delayed release  Amlodipine Besylate 10 mg tablet  Atenolol 100 mg tablet  Escitalopram Oxalate 10 mg tablet  Meloxicam 15 mg tablet  Methocarbamol 500 mg tablet  Trazodone Hcl 50 mg tablet     GU PSH: Locm 300-399Mg /Ml Iodine,1Ml - 07/31/2023     NON-GU PSH: Appendectomy (open) Back Surgery (Unspecified) Visit Complexity (formerly GPC1X) - 07/10/2023     GU PMH: Microscopic hematuria - 07/31/2023, - 07/10/2023 BPH w/o LUTS - 07/10/2023 Encounter for Prostate Cancer screening - 07/10/2023    NON-GU PMH: Arthritis GERD Hypertension Sleep Apnea    FAMILY HISTORY: No Family History    SOCIAL HISTORY: Marital Status: Married Current Smoking Status: Patient has never smoked.   Tobacco Use Assessment Completed: Used Tobacco in last 30 days? Drinks 5 drinks per day. Types of alcohol  consumed: Beer.  Drinks 2 caffeinated drinks per day.    REVIEW OF SYSTEMS:    GU Review Male:   Patient denies frequent urination, hard to postpone urination, burning/ pain with urination, get up at night to urinate, leakage of urine, stream starts and stops, trouble starting your stream, have to strain to urinate , erection problems, and penile pain.  Gastrointestinal (Upper):   Patient denies nausea, vomiting, and indigestion/ heartburn.  Gastrointestinal (Lower):   Patient denies diarrhea and constipation.  Constitutional:   Patient denies fever, night sweats, weight loss, and fatigue.  Skin:   Patient denies skin rash/ lesion and itching.  Eyes:   Patient denies blurred vision and double vision.  Ears/ Nose/ Throat:   Patient denies sore throat and sinus problems.  Hematologic/Lymphatic:  Patient denies swollen glands and easy bruising.  Cardiovascular:   Patient denies leg swelling and chest pains.  Respiratory:   Patient denies cough and shortness of breath.  Endocrine:   Patient denies excessive thirst.  Musculoskeletal:   Patient  denies back pain and joint pain.  Neurological:   Patient denies headaches and dizziness.  Psychologic:   Patient denies depression and anxiety.   VITAL SIGNS: None   MULTI-SYSTEM PHYSICAL EXAMINATION:    Constitutional: Well-nourished. No physical deformities. Normally developed. Good grooming.  Respiratory: No labored breathing, no use of accessory muscles.   Cardiovascular: Normal temperature, normal extremity pulses, no swelling, no varicosities.  Gastrointestinal: No mass, no tenderness, no rigidity, non obese abdomen.     Complexity of Data:  Source Of History:  Patient, Medical Record Summary  Records Review:   AUA Symptom Score, Previous Doctor Records, Previous Patient Records  X-Ray Review: C.T. Hematuria: Reviewed Films. Reviewed Report. Discussed With Patient.     PROCEDURES:         Flexible Cystoscopy - 52000  Risks, benefits, and some of the potential complications of the procedure were discussed at length with the patient including infection, bleeding, voiding discomfort, urinary retention, fever, chills, sepsis, and others. All questions were answered. Informed consent was obtained. Antibiotic prophylaxis was given. Sterile technique and intraurethral analgesia were used.  Meatus:  Normal size. Normal location. Normal condition.  Urethra:  No strictures.  External Sphincter:  Normal.  Verumontanum:  Normal.  Prostate:  Non-obstructing. No hyperplasia.  Bladder Neck:  Non-obstructing.  Ureteral Orifices:  Normal location. Normal size. Normal shape. Effluxed clear urine.  Bladder:  Small subcentimeter papillary lesion at the bladder neck and approximate 8 o'clock position.      The lower urinary tract was carefully examined. The procedure was well-tolerated and without complications. Antibiotic instructions were given. Instructions were given to call the office immediately for bloody urine, difficulty urinating, urinary retention, painful or frequent urination, fever,  chills, nausea, vomiting or other illness. The patient stated that he understood these instructions and would comply with them.         Visit Complexity - G2211          Urinalysis w/Scope Dipstick Dipstick Cont'd Micro  Color: Yellow Bilirubin: Neg mg/dL WBC/hpf: 0 - 5/hpf  Appearance: Clear Ketones: Neg mg/dL RBC/hpf: 0 - 2/hpf  Specific Gravity: 1.020 Blood: Trace ery/uL Bacteria: NS (Not Seen)  pH: 6.0 Protein: Neg mg/dL Cystals: NS (Not Seen)  Glucose: Neg mg/dL Urobilinogen: 0.2 mg/dL Casts: NS (Not Seen)    Nitrites: Neg Trichomonas: Not Present    Leukocyte Esterase: Neg leu/uL Mucous: Not Present      Epithelial Cells: NS (Not Seen)      Yeast: NS (Not Seen)      Sperm: Not Present    ASSESSMENT:      ICD-10 Details  1 GU:   Microscopic hematuria - R31.21   2   BPH w/o LUTS - N40.0   3   Encounter for Prostate Cancer screening - Z12.5   4   Bladder tumor/neoplasm - D41.4    PLAN:           Schedule Return Visit/Planned Activity: Next Available Appointment - Schedule Surgery          Document Letter(s):  Created for Patient: Clinical Summary         Notes:   1. Bladder lesion: He has a small subcentimeter papillary lesion at the bladder neck at the  8 o'clock position. I recommend going to the operating room for TURBT. Discussed risk and benefits. Surgery letter submitted.   Risks and benefits of Transurethral Resection of Bladder Tumor were reviewed in detail including infection, bleeding, blood transfusion, injury to bladder/urethra/surrounding structures, bladder perforation, obstructive and irritative voiding symptoms, and global anesthesia risks including but not limited to CVA, MI, DVT, PE, pneumonia, and death. Patient expressed understanding and desire to proceed.   CC: Sergio Hasty, DO   Urology Preoperative H&P   Chief Complaint: Bladder mass  History of Present Illness: Sergio Barr is a 48 y.o. male with a bladder mass here for TURBT, possible  instillation of gemcitabine. Denies fevers, chills, dysuria.    Past Medical History:  Diagnosis Date   Anxiety    Arthritis    Depression    Headache    Hypertension     Past Surgical History:  Procedure Laterality Date   BACK SURGERY      Allergies: No Known Allergies  History reviewed. No pertinent family history.  Social History:  reports that he has never smoked. He does not have any smokeless tobacco history on file. He reports current alcohol  use. He reports current drug use. Drug: Marijuana.  ROS: A complete review of systems was performed.  All systems are negative except for pertinent findings as noted.  Physical Exam:  Vital signs in last 24 hours: Temp:  [98 F (36.7 C)] 98 F (36.7 C) (04/25 0800) Pulse Rate:  [64] 64 (04/25 0800) Resp:  [18] 18 (04/25 0800) BP: (155)/(101) 155/101 (04/25 0800) SpO2:  [96 %] 96 % (04/25 0800) Weight:  [131.1 kg] 131.1 kg (04/25 0824) Constitutional:  Alert and oriented, No acute distress Cardiovascular: Regular rate and rhythm Respiratory: Normal respiratory effort, Lungs clear bilaterally GI: Abdomen is soft, nontender, nondistended, no abdominal masses GU: No CVA tenderness Lymphatic: No lymphadenopathy Neurologic: Grossly intact, no focal deficits Psychiatric: Normal mood and affect  Laboratory Data:  No results for input(s): "WBC", "HGB", "HCT", "PLT" in the last 72 hours.  No results for input(s): "NA", "K", "CL", "GLUCOSE", "BUN", "CALCIUM", "CREATININE" in the last 72 hours.  Invalid input(s): "CO3"   No results found for this or any previous visit (from the past 24 hours). No results found for this or any previous visit (from the past 240 hours).  Renal Function: No results for input(s): "CREATININE" in the last 168 hours. Estimated Creatinine Clearance: 130.3 mL/min (by C-G formula based on SCr of 0.96 mg/dL).  Radiologic Imaging: No results found.  I independently reviewed the above imaging  studies.  Assessment and Plan Sergio Barr is a 48 y.o. male with a bladder mass here for TURBT, possible instillation of gemcitabine.   Sergio R. Stokes Rattigan MD 09/20/2023, 9:29 AM  [facility] Pager: [phone]

## 2023-09-20 NOTE — Discharge Instructions (Signed)
Activity:  You are encouraged to ambulate frequently (about every hour during waking hours) to help prevent blood clots from forming in your legs or lungs.   ? ?Diet: You should advance your diet as instructed by your physician.  It will be normal to have some bloating, nausea, and abdominal discomfort intermittently. ? ?Prescriptions:  You will be provided a prescription for pain medication to take as needed.  If your pain is not severe enough to require the prescription pain medication, you may take extra strength Tylenol instead which will have less side effects.  You should also take a prescribed stool softener to avoid straining with bowel movements as the prescription pain medication may constipate you. ? ?What to call us about: You should call the office ([phone]) if you develop fever > 101 or develop persistent vomiting. Activity:  You are encouraged to ambulate frequently (about every hour during waking hours) to help prevent blood clots from forming in your legs or lungs.  ?

## 2023-09-20 NOTE — Anesthesia Procedure Notes (Signed)
 Procedure Name: LMA Insertion Date/Time: 09/20/2023 10:07 AM  Performed by: Hunter Maha, CRNAPre-anesthesia Checklist: Patient identified, Emergency Drugs available, Suction available and Patient being monitored Patient Re-evaluated:Patient Re-evaluated prior to induction Oxygen Delivery Method: Circle system utilized Preoxygenation: Pre-oxygenation with 100% oxygen Induction Type: IV induction Ventilation: Mask ventilation without difficulty LMA: LMA inserted LMA Size: 5.0 Tube type: Oral Number of attempts: 1 Airway Equipment and Method: Stylet and Oral airway Placement Confirmation: positive ETCO2 and breath sounds checked- equal and bilateral Tube secured with: Tape Dental Injury: Teeth and Oropharynx as per pre-operative assessment

## 2023-09-20 NOTE — Op Note (Signed)
 Operative Note  Preoperative diagnosis:  1.  Bladder lesion  Postoperative diagnosis: 1.  Bladder lesion  Procedure(s): 1.  TURBT small   Surgeon: Doy Gene, MD  Assistants:  None  Anesthesia:  General  Complications:  None  EBL:  Minimal  Specimens: 1.  ID Type Source Tests Collected by Time Destination  1 : Bladder neck lesion Tissue PATH GU resection / TURBT / partial nephrectomy SURGICAL PATHOLOGY [name], MD 09/20/2023 1004    Drains/Catheters: 1.  None  Intraoperative findings:   Small approximately 0.75cm papillary lesion at the bladder neck. Excellent resection with excellent hemostasis.  Number of tumors:        1 Size of largest tumor:        0.75cm  Characteristics of tumors:     Papillary    Primary Yes  Suspicious for Carcinoma in situ:    No  Clinical tumor stage:         cTa  Bimanual exam under anesthesia:        Yes - no 3D mass  Visually complete resection:                 Yes  Visualization of detrusor muscle in resection base:        No  Visual evaluation for perforation:             Yes - no perforation   Indication:  Sergio Barr is a 48 y.o. male with a small bladder neck bladder lesion here for TURBT. All the risks, benefits were discussed with the patient to include but not limited to infection, pain, bleeding, damage to adjacent structures, need for further operations, adverse reaction to anesthesia and death.  Patient understands these risks and agrees to proceed with the operation as planned.    Description of procedure: After informed consent was obtained from the patient, the patient was taken to the operating room. General anesthesia was administered. The patient was placed in dorsal lithotomy position and prepped and draped in usual sterile fashion. Sequential compression devices were applied to lower extremities at the beginning of the case for DVT prophylaxis. Antibiotics were infused prior to surgery start time. A  surgical time-out was performed to properly identify the patient, the surgery to be performed, and the surgical site.     We then passed the 21-French rigid cystoscope down the urethra and into the bladder under direct vision without any difficulty. The anterior urethral demonstrated approximately 18 french bulbar narrowing easily dilated and passable with the scope. The prostate was non-obstructing. The bladder was inspected with 30 and 70 degree lenses. Once in the bladder, systematic evaluation of bladder revealed a small 0.75cm papillary lesion at the bladder neck. The ureteral orfices were in orthotopic position and not involved.   We then removed the cystoscope and then passed down the 26 French resectoscope sheath down the urethra into the bladder under direct vision with the visual obturator. The lesion was resected. The TUR bladder tumor chips were retrieved from the bladder and each region of resection was passed off the field as a separate specimen.  Hemostasis was achieved using electrocautery. Given the chance that this is benign, I elected not to instill gemcitabine. No catheter was placed. The patient tolerated the procedure well with no complication and was awoken from anesthesia and taken to recovery in stable condition.     Matt R. [name] MD [facility]  Pager: [phone]

## 2023-09-21 ENCOUNTER — Encounter (HOSPITAL_COMMUNITY): Payer: Self-pay | Admitting: Urology

## 2023-09-23 LAB — SURGICAL PATHOLOGY
